# Patient Record
Sex: Male | Born: 1960 | Race: White | Hispanic: No | Smoking: Never smoker
Health system: Southern US, Community
[De-identification: ages and names within clinical notes are randomized; demographics above are authoritative.]

## PROBLEM LIST (undated history)

## (undated) DIAGNOSIS — Z8701 Personal history of pneumonia (recurrent): Secondary | ICD-10-CM

## (undated) HISTORY — PX: COLONOSCOPY: SHX5424

## (undated) HISTORY — PX: TESTICLE SURGERY: SHX794

## (undated) HISTORY — PX: HERNIA REPAIR: SHX51

---

## 1999-10-12 ENCOUNTER — Ambulatory Visit (HOSPITAL_COMMUNITY): Admission: RE | Admit: 1999-10-12 | Discharge: 1999-10-12 | Payer: Self-pay | Admitting: *Deleted

## 1999-10-12 ENCOUNTER — Encounter (INDEPENDENT_AMBULATORY_CARE_PROVIDER_SITE_OTHER): Payer: Self-pay

## 2007-03-23 ENCOUNTER — Ambulatory Visit (HOSPITAL_COMMUNITY): Admission: RE | Admit: 2007-03-23 | Discharge: 2007-03-23 | Payer: Self-pay | Admitting: General Surgery

## 2010-06-30 HISTORY — PX: INGUINAL HERNIA REPAIR: SHX194

## 2010-07-13 NOTE — Op Note (Signed)
NAMEKHIREE, BUKHARI          ACCOUNT NO.:  [id number]   MEDICAL RECORD NO.:  [id number]          PATIENT TYPE:  AMB   LOCATION:  DAY                          FACILITY:  [facility]   PHYSICIAN:  Sharlet Salina T. Hoxworth, M.D.DATE OF BIRTH:  Jul 18, 1960   DATE OF PROCEDURE:  03/23/2007  DATE OF DISCHARGE:                               OPERATIVE REPORT   PREOPERATIVE DIAGNOSES:  Bilateral inguinal hernia.   POSTOPERATIVE DIAGNOSES:  Bilateral inguinal hernia.   SURGICAL PROCEDURES:  Laparoscopic repair of bilateral inguinal hernia.   SURGEON:  Dr. Johna Sheriff.   ANESTHESIA:  General.   BRIEF HISTORY:  Mr. Schreier is a 45-year male who presents with a  gradually increasing somewhat uncomfortable bulge of the left groin.  Examination reveals a good-sized probably direct left inguinal hernia as  well as a definite smaller right inguinal hernia, both reducible.  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  After discussion of options, we have elected to proceed with  laparoscopic bilateral repair of his inguinal hernia.  The nature of the  procedure, risks of anesthesia complications, bleeding, infection,  recurrence, and possible need for open procedure were discussed and  understood.  He is now brought to operating room for this procedure.   DESCRIPTION OF OPERATION:  The patient was brought to the operating room  and placed in the supine position on the operating table and general  endotracheal anesthesia was induced.  A Foley catheter was placed.  The  abdomen was widely sterilely prepped and draped.  Correct patient and  procedure were verified.  He received preoperative IV antibiotics.  A 1  cm incision was made transversely just below the umbilicus and  dissection carried down to the anterior fascia.  This was incised  transversely for 1 cm and the medial edge of the left rectus muscle  identified, retracted laterally and the preperitoneal space  entered  under direct vision.  The balloon dissector was passed with its tip into  this space and then down along the midline to the pubic symphysis.  Then  under laparoscopic vision, the balloon was inflated with apparently good  bilateral deployment.  It was left in place for several minutes for  hemostasis and then removed and the structural balloon trocar placed and  CO2 pressure applied.  Laparoscopy revealed that on the left where he  had his large indirect hernia, there had been somewhat limited  dissection of the peritoneum and the epigastric vessels had been  dissected posteriorly along with the peritoneum.  Initially I tried a  dissection plane between the epigastric vessels and the peritoneum but  they were really quite densely adherent to the peritoneum and I  therefore divided the epigastric vessels between double proximal and  distal clips which then allowed the peritoneum to fall posteriorly and  it was dissected off the anterior abdominal wall well out laterally to  the anterior-superior iliac spine and up as high as the umbilicus.  Going back medially,  the pubic symphysis had been identified previously  and Cooper's ligament was dissected down to the iliac vessels which  were  identified and carefully protected.  There was no direct defect.  There  was a large indirect sac going up to the internal ring with the cord  structures.  This sac was then carefully dissected away from cord  structures.  It was a large sac extending down into the upper scrotum  but that she was able to be completely dissected out of the internal  ring away from cord structures and dissected well posteriorly until the  peritoneal edge was well posterior from the lateral dissection all the  way back medially.  After complete dissection of the left side,  attention was turned to the right side up.  The Cooper's ligament was  again cleared down to the iliac vessels which were identified and   protected.  The peritoneum was then dissected off the anterior abdominal  wall working medial to lateral again dissecting the peritoneum out  laterally to the anterior superior iliac spine and up to the level of  the umbilicus.  The peritoneal edge was then followed back medially and  again there was a indirect hernia present.  There was actually quite a  lot of scarring here of very thin peritoneum to the cord structures from  the patient's previous surgery for undescended testicle. The peritoneum  was able to be dissected down out of the internal ring and off the cord  structures but it was very thin and adherent and peritoneum was opened  for a couple of centimeters.  After complete dissection of the  peritoneum well posteriorly down off the cord structures, it was closed  with clips.  Following this initially on the right side, a large right-  sided Bard 3-D max piece of mesh was introduced in the preperitoneal  space, oriented and then tacked with its medial corner at the pubic  symphysis and then was deployed well out laterally and beginning along  the superior edge where the tack could be felt through the anterior  abdominal wall and was tacked along the superior edge working back  medially avoiding the epigastric vessels, working down the medial edge  to the pubic symphysis and then tacked along Cooper's ligament.  This  provided nice broad coverage of the direct and indirect spaces.  Following this, a left-sided Bard 3-D max piece of mesh was introduced  and was affixed in an identical fashion on the left side.  On the left,  the large indirect sac was then tacked deep to the mesh.  On the right  the peritoneum was definitely seen to be deep to the mesh and the mesh  was held in place while all CO2 was evacuated after hemostasis assured.  Trocars were removed.  The fascial defect was closed with figure-of-  eight suture of #0 Vicryl. The skin was closed with subcuticular 4-0   Monocryl and Dermabond.  Sponge, needle and instrument counts correct.  The patient taken to recovery in good condition.      Lorne Skeens. Hoxworth, M.D.  Electronically Signed     BTH/MEDQ  D:  03/23/2007  T:  03/23/2007  Job:  [id number]

## 2010-07-16 NOTE — Procedures (Signed)
[facility]  Patient:    Aaron Woodward, Aaron Woodward                   MRN: [id number] Proc. Date: 10/12/99 Attending:  Sabino Gasser, M.D.                           Procedure Report  PROCEDURE:  Colonoscopy.  ENDOSCOPIST:  Sabino Gasser, M.D.  INDICATIONS:  Rectal bleeding.  ANESTHESIA:  Demerol 50 mg and Versed 6 mg was given intravenously in divided dose.  DESCRIPTION OF PROCEDURE:  With the patient mildly sedated in the left lateral decubitus position, rectal examination was performed which was unremarkable. Subsequently, the Olympus videoscopic colonoscope was then inserted in the rectum and passed under direct vision to the cecum.  The cecum was identified by the ileocecal valve and appendiceal orifice, both of which were photographed.  We entered into the ileocecal valve to the terminal ileum and some changes in the ileum of polypoid nature were seen, photographed, and biopsied.  From this point, the endoscope was slowly withdrawn, taking circumferential views of the entire colonic mucosa, stopping all the way down in the rectum which appeared normal on direct and retroflexed view, and also, there was a normal exam as we pulled through the anal canal.  The patients vital signs and pulse oximeter remained stable.  The patient tolerated the procedure well without apparent complication.  FINDINGS:  Possible small hemorrhoids of the rectum, otherwise unremarkable examination.  PLAN:  Endoscopy. DD:  10/12/99 TD:  10/12/99 Job: [id number]

## 2010-07-16 NOTE — Procedures (Signed)
[facility]  Patient:    TAL, KEMPKER                   MRN: [id number] Proc. Date: 10/12/99 Adm. Date:  [id number] Disc. Date: [id number] Attending:  Sabino Gasser                           Procedure Report  PROCEDURE:  Upper endoscopy with biopsy.  ENDOSCOPIST:  Sabino Gasser, M.D.  INDICATIONS:  Rectal bleeding.  ANESTHESIA:  Demerol none, Versed 1 mg.  DESCRIPTION OF PROCEDURE:  With the patient mildly sedated in the left lateral decubitus position, the Olympus videoscopic endoscope was inserted into the mouth and passed under direct vision through the esophagus which appeared normal.  The fundus, body and antrum all appeared normal.  We entered into the duodenal bulb and there were mild changes of duodenitis, photographed.  The endoscope was advanced into the second portion of the duodenum which was normal.  From this point, the endoscope was slowly withdrawn, taking circumferential views of the entire duodenal mucosa.  The scope was pulled back into the stomach.  CLO biopsy was taken from the antrum, and the scope was placed in retroflexion to view the stomach from which appeared normal. The endoscope was straightened and withdrawn taking circumferential views of the remaining gastric and esophageal mucosa which appeared normal.  The patients vital signs and pulse oximetry remained stable.  The patient tolerated the procedure well without apparent complications.  FINDINGS:  Mild duodenitis.  PLAN:  Await CLO biopsy report and have patient follow up with me as an outpatient. DD:  10/12/99 TD:  10/12/99 Job: [id number]

## 2010-11-18 LAB — HEMOGLOBIN AND HEMATOCRIT, BLOOD
HCT: 47.5
Hemoglobin: 16.4

## 2013-04-27 ENCOUNTER — Emergency Department (INDEPENDENT_AMBULATORY_CARE_PROVIDER_SITE_OTHER): Payer: BC Managed Care – PPO

## 2013-04-27 ENCOUNTER — Emergency Department
Admission: EM | Admit: 2013-04-27 | Discharge: 2013-04-27 | Disposition: A | Discharge status: Home / Self Care | Payer: BC Managed Care – PPO | Source: Home / Self Care | Attending: Family Medicine | Admitting: Family Medicine

## 2013-04-27 ENCOUNTER — Encounter: Payer: Self-pay | Admitting: Emergency Medicine

## 2013-04-27 DIAGNOSIS — R059 Cough, unspecified: Secondary | ICD-10-CM

## 2013-04-27 DIAGNOSIS — Z8701 Personal history of pneumonia (recurrent): Secondary | ICD-10-CM | POA: Insufficient documentation

## 2013-04-27 DIAGNOSIS — R05 Cough: Secondary | ICD-10-CM

## 2013-04-27 DIAGNOSIS — J209 Acute bronchitis, unspecified: Secondary | ICD-10-CM

## 2013-04-27 HISTORY — DX: Personal history of pneumonia (recurrent): Z87.01

## 2013-04-27 LAB — POCT CBC W AUTO DIFF (K'VILLE URGENT CARE)

## 2013-04-27 MED ORDER — AZITHROMYCIN 250 MG PO TABS: ORAL_TABLET | ORAL | Status: DC

## 2013-04-27 MED ORDER — BENZONATATE 200 MG PO CAPS: 200.0000 mg | ORAL_CAPSULE | Freq: Every day | ORAL | Status: DC

## 2013-04-27 MED ORDER — PREDNISONE 20 MG PO TABS: 20.0000 mg | ORAL_TABLET | Freq: Two times a day (BID) | ORAL | Status: DC

## 2013-04-27 NOTE — ED Provider Notes (Signed)
CSN: [id number]     Arrival date & time 04/27/13  1533 History   First MD Initiated Contact with Patient 04/27/13 1705     Chief Complaint  Patient presents with  . Cough    2 - 3 days      HPI Comments: Patient developed mild sore throat, cough, and nasal congestion that started about 8 days ago.  The non-productive cough has steadily worsened.  The cough awakens him at night.  Two days ago he had chills, and today has had pleuritic pain in his left anterior chest.  He has become more easily winded over the past several days.  The history is provided by the patient.    Past Medical History  Diagnosis Date  . History of pneumonia    Past Surgical History  Procedure Laterality Date  . Testicle surgery    . Hernia repair     Family History  Problem Relation Age of Onset  . COPD Mother    History  Substance Use Topics  . Smoking status: Never Smoker   . Smokeless tobacco: Never Used  . Alcohol Use: No    Review of Systems + sore throat, resolved + cough ? pleuritic pain left lateral chest + wheezing + nasal congestion + post-nasal drainage No sinus pain/pressure No itchy/red eyes No earache No hemoptysis + SOB No fever, + chills No nausea No vomiting No abdominal pain No diarrhea No urinary symptoms No skin rash + fatigue No myalgias + headache Used OTC meds without relief  Allergies  Review of patient's allergies indicates no known allergies.  Home Medications   Current Outpatient Rx  Name  Route  Sig  Dispense  Refill  . Phenylephrine-DM-GG-APAP (DAYTIME SEVERE COLD & FLU PO)   Oral   Take by mouth.         Marland Kitchen azithromycin (ZITHROMAX Z-PAK) 250 MG tablet      Take 2 tabs today; then begin one tab once daily for 4 more days.   6 each   0   . benzonatate (TESSALON) 200 MG capsule   Oral   Take 1 capsule (200 mg total) by mouth at bedtime. Take as needed for cough   12 capsule   0   . predniSONE (DELTASONE) 20 MG tablet   Oral   Take 1  tablet (20 mg total) by mouth 2 (two) times daily. Take with food.   10 tablet   0    BP 107/74  Pulse 93  Temp(Src) 99.6 F (37.6 C) (Oral)  Ht 5\' 8"  (1.727 m)  Wt 160 lb (72.576 kg)  BMI 24.33 kg/m2  SpO2 100% Physical Exam Nursing notes and Vital Signs reviewed. Appearance:  Patient appears healthy, stated age, and in no acute distress Eyes:  Pupils are equal, round, and reactive to light and accomodation.  Extraocular movement is intact.  Conjunctivae are not inflamed  Ears:  Canals normal.  Tympanic membranes normal.  Nose:  Mildly congested turbinates.  No sinus tenderness.  Pharynx:  Normal Neck:  Supple.  No adenopathy Lungs:  Clear to auscultation.  Breath sounds are equal.  Heart:  Regular rate and rhythm without murmurs, rubs, or gallops.  Abdomen:  Nontender without masses or hepatosplenomegaly.  Bowel sounds are present.  No CVA or flank tenderness.  Extremities:  No edema.  No calf tenderness Skin:  No rash present.   ED Course  Procedures  none    Labs Reviewed  POCT CBC W AUTO DIFF ([facility]) WBC:  6.3; LY 16.3; MO 6.0; GR 77.7; Hgb 15.6; Platelets 181    Imaging Review Dg Chest 2 View  04/27/2013   CLINICAL DATA:  Cough  EXAM: CHEST  2 VIEW  COMPARISON:  None.  FINDINGS: Cardiomediastinal silhouette is unremarkable. No acute infiltrate or pleural effusion. No pulmonary edema. Bony thorax is unremarkable.  IMPRESSION: No active cardiopulmonary disease.   Electronically Signed   By: Natasha MeadLiviu  Pop M.D.   On: 04/27/2013 17:53     MDM   1. Acute bronchitis    Begin Z-pack to cover atypicals, and prednisone burst.  Prescription written for Benzonatate (Tessalon) to take at bedtime for night-time cough.  Take plain Mucinex (1200 mg guaifenesin) twice daily for cough and congestion.  May add Sudafed if sinus congestion worsens.   Increase fluid intake, rest. Stop all antihistamines for now, and other non-prescription cough/cold preparations.  Follow-up  with family doctor if not improving 7 to 10 days.     Lattie HawStephen A Beese, MD 04/29/13 [phone]

## 2013-04-27 NOTE — ED Notes (Signed)
Aaron Woodward complains of dry cough for 2 - 3 days. He also reports fevers, body aches, dizziness, headaches, sore throat, runny nose, congestion, wheezing and shortness of breath.

## 2013-04-27 NOTE — Discharge Instructions (Signed)
Take plain Mucinex (1200 mg guaifenesin) twice daily for cough and congestion.  May add Sudafed if sinus congestion worsens.   Increase fluid intake, rest. Stop all antihistamines for now, and other non-prescription cough/cold preparations.  Follow-up with family doctor if not improving 7 to 10 days.

## 2013-05-27 ENCOUNTER — Encounter: Payer: Self-pay | Admitting: Emergency Medicine

## 2013-05-27 ENCOUNTER — Emergency Department
Admission: EM | Admit: 2013-05-27 | Discharge: 2013-05-27 | Disposition: A | Discharge status: Home / Self Care | Payer: BC Managed Care – PPO | Source: Home / Self Care | Attending: Family Medicine | Admitting: Family Medicine

## 2013-05-27 DIAGNOSIS — R053 Chronic cough: Secondary | ICD-10-CM

## 2013-05-27 DIAGNOSIS — R05 Cough: Secondary | ICD-10-CM

## 2013-05-27 DIAGNOSIS — R059 Cough, unspecified: Secondary | ICD-10-CM

## 2013-05-27 LAB — POCT CBC W AUTO DIFF (K'VILLE URGENT CARE)

## 2013-05-27 MED ORDER — TUBERCULIN PPD 5 UNIT/0.1ML ID SOLN
5.0000 [IU] | Freq: Once | INTRADERMAL | Status: DC
  Administered 2013-05-27: 5 [IU] via INTRADERMAL

## 2013-05-27 NOTE — ED Notes (Signed)
Pt c/o lingering non productive cough with LT rib area pain since his visit on 04/27/13. Denies fever, however he does report having night sweats last night.

## 2013-05-27 NOTE — Discharge Instructions (Signed)
May try Tessalon (benzonatate) daytime for cough   Cough, Adult  A cough is a reflex that helps clear your throat and airways. It can help heal the body or may be a reaction to an irritated airway. A cough may only last 2 or 3 weeks (acute) or may last more than 8 weeks (chronic).  CAUSES Acute cough:  Viral or bacterial infections. Chronic cough:  Infections.  Allergies.  Asthma.  Post-nasal drip.  Smoking.  Heartburn or acid reflux.  Some medicines.  Chronic lung problems (COPD).  Cancer. SYMPTOMS   Cough.  Fever.  Chest pain.  Increased breathing rate.  High-pitched whistling sound when breathing (wheezing).  Colored mucus that you cough up (sputum). TREATMENT   A bacterial cough may be treated with antibiotic medicine.  A viral cough must run its course and will not respond to antibiotics.  Your caregiver may recommend other treatments if you have a chronic cough. HOME CARE INSTRUCTIONS   Only take over-the-counter or prescription medicines for pain, discomfort, or fever as directed by your caregiver. Use cough suppressants only as directed by your caregiver.  Use a cold steam vaporizer or humidifier in your bedroom or home to help loosen secretions.  Sleep in a semi-upright position if your cough is worse at night.  Rest as needed.  Stop smoking if you smoke. SEEK IMMEDIATE MEDICAL CARE IF:   You have pus in your sputum.  Your cough starts to worsen.  You cannot control your cough with suppressants and are losing sleep.  You begin coughing up blood.  You have difficulty breathing.  You develop pain which is getting worse or is uncontrolled with medicine.  You have a fever. MAKE SURE YOU:   Understand these instructions.  Will watch your condition.  Will get help right away if you are not doing well or get worse. Document Released: 08/13/2010 Document Revised: 05/09/2011 Document Reviewed: 08/13/2010 [facility] Patient Information  2014 [employer] [place].

## 2013-05-27 NOTE — ED Provider Notes (Signed)
CSN: [id number]     Arrival date & time 05/27/13  1600 History   First MD Initiated Contact with Patient 05/27/13 1614     Chief Complaint  Patient presents with  . Cough      HPI Comments: Patient was treated for a bronchitis approximately one month ago with azithromycin and prednisone burst.  Chest x-ray at that time was negative, and white blood count was not elevated.  He improved initially, and his cough then became persistent.  The cough is worse in the morning and non-productive.  He denies fever, but last night had sweats.  He has developed a lingering pleuritic pain in his left anterior ribs.  He denies history of reflux symptoms.  The history is provided by the patient.    Past Medical History  Diagnosis Date  . History of pneumonia    Past Surgical History  Procedure Laterality Date  . Testicle surgery    . Hernia repair     Family History  Problem Relation Age of Onset  . COPD Mother    History  Substance Use Topics  . Smoking status: Never Smoker   . Smokeless tobacco: Never Used  . Alcohol Use: No    Review of Systems No sore throat + cough + left pleuritic pain No wheezing No nasal congestion No post-nasal drainage No sinus pain/pressure No itchy/red eyes No earache No hemoptysis No SOB No fever/chills, + night sweats No nausea No vomiting No abdominal pain No diarrhea No urinary symptoms No skin rash No fatigue No myalgias No headache    Allergies  Review of patient's allergies indicates no known allergies.  Home Medications   Current Outpatient Rx  Name  Route  Sig  Dispense  Refill  . azithromycin (ZITHROMAX Z-PAK) 250 MG tablet      Take 2 tabs today; then begin one tab once daily for 4 more days.   6 each   0   . benzonatate (TESSALON) 200 MG capsule   Oral   Take 1 capsule (200 mg total) by mouth at bedtime. Take as needed for cough   12 capsule   0   . Phenylephrine-DM-GG-APAP (DAYTIME SEVERE COLD & FLU PO)   Oral  Take by mouth.         . predniSONE (DELTASONE) 20 MG tablet   Oral   Take 1 tablet (20 mg total) by mouth 2 (two) times daily. Take with food.   10 tablet   0    BP 107/74  Pulse 78  Temp(Src) 98.9 F (37.2 C) (Oral)  Resp 16  Ht 5\' 8"  (1.727 m)  Wt 154 lb (69.854 kg)  BMI 23.42 kg/m2  SpO2 97% Physical Exam  Nursing note and vitals reviewed. Constitutional: He is oriented to person, place, and time. He appears well-developed and well-nourished. No distress.  HENT:  Head: Normocephalic and atraumatic.  Nose: Nose normal.  Mouth/Throat: Oropharynx is clear and moist.  tympanic membranes normal  Eyes: Conjunctivae are normal. Pupils are equal, round, and reactive to light.  Neck: Neck supple.  Cardiovascular: Normal heart sounds.   Pulmonary/Chest: Breath sounds normal. No respiratory distress. He has no wheezes. He has no rales. He exhibits tenderness.    There is tenderness over the left anterior/inferior ribs as noted on diagram.  Abdominal: He exhibits no mass. There is no tenderness.  Musculoskeletal: He exhibits no edema.  Lymphadenopathy:    He has no cervical adenopathy.  Neurological: He is alert and oriented to  person, place, and time.  Skin: Skin is warm and dry. No rash noted.    ED Course  Procedures  none    Labs Reviewed  MISCELLANEOUS TEST  POCT CBC W AUTO DIFF ([facility]):  WBC 9.1; LY 35.7; MO 4.4; GR 59.9; Hgb 16.2; Platelets 231         MDM   1. Persistent cough    Check pertussis antibodies and PPD May try Tessalon (benzonatate) daytime for cough If not improved two weeks consider pulmonology evaluation.    Lattie Haw, MD 06/01/13 [phone]

## 2013-05-29 ENCOUNTER — Emergency Department
Admission: EM | Admit: 2013-05-29 | Discharge: 2013-05-29 | Disposition: A | Discharge status: Home / Self Care | Payer: Self-pay | Source: Home / Self Care

## 2013-05-29 LAB — READ PPD: TB Skin Test: NEGATIVE

## 2013-05-29 LAB — BORDETELLA PERTUSSIS PCR
B parapertussis, DNA: NOT DETECTED
B pertussis, DNA: NOT DETECTED

## 2013-05-29 NOTE — ED Notes (Signed)
The pt is here today for a PPD reading. Results negative, 00mm. Pt also request ABT for his persistent cough. Per dr Cathren Harshbeese called in Biaxin 250mg  i po bid # 20/0 to [facility] [place].

## 2013-06-10 LAB — CULTURE, BORDETELLA PERTUSSIS

## 2013-06-11 ENCOUNTER — Telehealth: Payer: Self-pay | Admitting: *Deleted

## 2014-06-09 ENCOUNTER — Emergency Department (INDEPENDENT_AMBULATORY_CARE_PROVIDER_SITE_OTHER): Payer: BLUE CROSS/BLUE SHIELD

## 2014-06-09 ENCOUNTER — Emergency Department
Admission: EM | Admit: 2014-06-09 | Discharge: 2014-06-09 | Disposition: A | Discharge status: Home / Self Care | Payer: BLUE CROSS/BLUE SHIELD | Source: Home / Self Care | Attending: Family Medicine | Admitting: Family Medicine

## 2014-06-09 ENCOUNTER — Encounter: Payer: Self-pay | Admitting: Emergency Medicine

## 2014-06-09 DIAGNOSIS — R053 Chronic cough: Secondary | ICD-10-CM

## 2014-06-09 DIAGNOSIS — R05 Cough: Secondary | ICD-10-CM | POA: Diagnosis not present

## 2014-06-09 DIAGNOSIS — R0989 Other specified symptoms and signs involving the circulatory and respiratory systems: Secondary | ICD-10-CM

## 2014-06-09 DIAGNOSIS — Z8701 Personal history of pneumonia (recurrent): Secondary | ICD-10-CM | POA: Diagnosis not present

## 2014-06-09 MED ORDER — MONTELUKAST SODIUM 10 MG PO TABS: 10.0000 mg | ORAL_TABLET | Freq: Every day | ORAL | Status: DC

## 2014-06-09 MED ORDER — PREDNISONE 20 MG PO TABS: 20.0000 mg | ORAL_TABLET | Freq: Two times a day (BID) | ORAL | Status: DC

## 2014-06-09 MED ORDER — AZITHROMYCIN 250 MG PO TABS: ORAL_TABLET | ORAL | Status: DC

## 2014-06-09 NOTE — ED Notes (Signed)
Dry cough x 5 weeks

## 2014-06-09 NOTE — Discharge Instructions (Signed)
Stop all antihistamines for now. May take Delsym Cough Suppressant at bedtime for nighttime cough.   Begin Azithromycin if not improving about one week or if persistent fever develops  Follow-up with family doctor if not improving about10 days.

## 2014-06-09 NOTE — ED Provider Notes (Signed)
CSN: [id number]     Arrival date & time 06/09/14  [id number] History   First MD Initiated Contact with Patient 06/09/14 1135     Chief Complaint  Patient presents with  . Cough     HPI Comments: Patient complains of onset of a non-productive cough and mildly irritated throat about 5 weeks ago.  The cough has persisted but he does not feel ill.  He had a similar prolonged cough one year ago.  No fevers, chills, and sweats.  No pleuritic pain.  No shortness of breath but he wheezes at times.  No past history of asthma.  The history is provided by the patient.    Past Medical History  Diagnosis Date  . History of pneumonia    Past Surgical History  Procedure Laterality Date  . Testicle surgery    . Hernia repair     Family History  Problem Relation Age of Onset  . COPD Mother   . Diabetes Mother    History  Substance Use Topics  . Smoking status: Never Smoker   . Smokeless tobacco: Never Used  . Alcohol Use: No    Review of Systems + minimal sore throat + cough + sneezing No pleuritic pain + wheezing + nasal congestion ? post-nasal drainage No sinus pain/pressure No itchy/red eyes No earache No hemoptysis No SOB No fever/chills No nausea No vomiting No abdominal pain No diarrhea No urinary symptoms No skin rash No fatigue No myalgias No headache Used OTC meds without relief  Allergies  Review of patient's allergies indicates not on file.  Home Medications   Prior to Admission medications   Medication Sig Start Date End Date Taking? Authorizing Provider  pneumococcal 13-valent conjugate vaccine (PREVNAR 13) SUSP injection Inject 0.5 mLs into the muscle tomorrow at 10 am.   Yes Historical Provider, MD  azithromycin (ZITHROMAX Z-PAK) 250 MG tablet Take 2 tabs today; then begin one tab once daily for 4 more days. (Rx void after 06/17/14) 06/09/14   Lattie HawStephen A , MD  montelukast (SINGULAIR) 10 MG tablet Take 1 tablet (10 mg total) by mouth at bedtime. 06/09/14    Lattie HawStephen A , MD  Phenylephrine-DM-GG-APAP (DAYTIME SEVERE COLD & FLU PO) Take by mouth.    Historical Provider, MD  predniSONE (DELTASONE) 20 MG tablet Take 1 tablet (20 mg total) by mouth 2 (two) times daily. Take with food. 06/09/14   Lattie HawStephen A , MD   BP 114/74 mmHg  Pulse 66  Temp(Src) 97.8 F (36.6 C) (Oral)  Ht 5\' 8"  (1.727 m)  Wt 163 lb (73.936 kg)  BMI 24.79 kg/m2  SpO2 99% Physical Exam Nursing notes and Vital Signs reviewed. Appearance:  Patient appears stated age, and in no acute distress Eyes:  Pupils are equal, round, and reactive to light and accomodation.  Extraocular movement is intact.  Conjunctivae are not inflamed  Ears:  Canals normal.  Tympanic membranes normal.  Nose:  Mildly congested turbinates.  No sinus tenderness.  Pharynx:  Normal Neck:  Supple.  No adenopathy Lungs:  Clear to auscultation.  Breath sounds are equal.  Heart:  Regular rate and rhythm without murmurs, rubs, or gallops.  Abdomen:  Nontender without masses or hepatosplenomegaly.  Bowel sounds are present.  No CVA or flank tenderness.  Extremities:  No edema.  No calf tenderness Skin:  No rash present.   ED Course  Procedures  none  Imaging Review Dg Chest 2 View  06/09/2014   CLINICAL DATA:  Five week  history of cough and congestion  EXAM: CHEST  2 VIEW  COMPARISON:  April 27, 2013  FINDINGS: There is no edema or consolidation. The heart size and pulmonary vascularity are normal. No adenopathy. No pneumothorax. No bone lesions.  IMPRESSION: No edema or consolidation.   Electronically Signed   By: Bretta Bang III M.D.   On: 06/09/2014 11:46     MDM   1. Persistent cough for 3 weeks or longer; suspect allergy mediated  2. History of pneumonia    Begin prednisone burst.  Begin trial of Singulair Stop all antihistamines for now. May take Delsym Cough Suppressant at bedtime for nighttime cough.   Begin Azithromycin if not improving about one week or if persistent fever  develops (Given a prescription to hold, with an expiration date)  Follow-up with family doctor if not improving about10 days.     Lattie Haw, MD 06/11/14 [phone]

## 2015-01-13 ENCOUNTER — Encounter: Payer: Self-pay | Admitting: *Deleted

## 2015-01-13 ENCOUNTER — Emergency Department
Admission: EM | Admit: 2015-01-13 | Discharge: 2015-01-13 | Disposition: A | Discharge status: Home / Self Care | Payer: BLUE CROSS/BLUE SHIELD | Source: Home / Self Care | Attending: Family Medicine | Admitting: Family Medicine

## 2015-01-13 DIAGNOSIS — J069 Acute upper respiratory infection, unspecified: Secondary | ICD-10-CM | POA: Diagnosis not present

## 2015-01-13 MED ORDER — DM-GUAIFENESIN ER 30-600 MG PO TB12: 1.0000 | ORAL_TABLET | Freq: Two times a day (BID) | ORAL | Status: DC

## 2015-01-13 MED ORDER — AZITHROMYCIN 250 MG PO TABS: 250.0000 mg | ORAL_TABLET | Freq: Every day | ORAL | Status: DC

## 2015-01-13 MED ORDER — BENZONATATE 100 MG PO CAPS: 100.0000 mg | ORAL_CAPSULE | Freq: Three times a day (TID) | ORAL | Status: DC

## 2015-01-13 NOTE — ED Notes (Signed)
Pt c/o non productive cough x 3-4 days. Denies fever. Reports a hx of pneumonia.

## 2015-01-13 NOTE — Discharge Instructions (Signed)
You may take 400-600mg  Ibuprofen (Motrin) every 6-8 hours for fever and pain  Alternate with Tylenol  You may take  Tylenol every 4-6 hours as needed for fever and pain  Follow-up with your primary care provider next week for recheck of symptoms if not improving.  Be sure to drink plenty of fluids and rest, at least 8hrs of sleep a night, preferably more while you are sick. Return urgent care or go to closest ER if you cannot keep down fluids/signs of dehydration, fever not reducing with Tylenol, difficulty breathing/wheezing, stiff neck, worsening condition, or other concerns (see below)   [employer] may help relieve the symptoms of a cough and cold. They add moisture to the air, which helps mucus to become thinner and less sticky. This makes it easier to breathe and cough up secretions. Cool mist vaporizers do not cause serious burns like hot mist vaporizers, which may also be called steamers or humidifiers. Vaporizers have not been proven to help with colds. You should not use a vaporizer if you are allergic to mold. HOME CARE INSTRUCTIONS  Follow the package instructions for the vaporizer.  Do not use anything other than distilled water in the vaporizer.  Do not run the vaporizer all of the time. This can cause mold or bacteria to grow in the vaporizer.  Clean the vaporizer after each time it is used.  Clean and dry the vaporizer well before storing it.  Stop using the vaporizer if worsening respiratory symptoms develop.   This information is not intended to replace advice given to you by your health care provider. Make sure you discuss any questions you have with your health care provider.   Document Released: 11/12/2003 Document Revised: 02/19/2013 Document Reviewed: 07/04/2012 Elsevier Interactive Patient Education 2016 Elsevier Inc.  Upper Respiratory Infection, Adult Most upper respiratory infections (URIs) are a viral infection of the air passages  leading to the lungs. A URI affects the nose, throat, and upper air passages. The most common type of URI is nasopharyngitis and is typically referred to as "the common cold." URIs run their course and usually go away on their own. Most of the time, a URI does not require medical attention, but sometimes a bacterial infection in the upper airways can follow a viral infection. This is called a secondary infection. Sinus and middle ear infections are common types of secondary upper respiratory infections. Bacterial pneumonia can also complicate a URI. A URI can worsen asthma and chronic obstructive pulmonary disease (COPD). Sometimes, these complications can require emergency medical care and may be life threatening.  CAUSES Almost all URIs are caused by viruses. A virus is a type of germ and can spread from one person to another.  RISKS FACTORS You may be at risk for a URI if:   You smoke.   You have chronic heart or lung disease.  You have a weakened defense (immune) system.   You are very young or very old.   You have nasal allergies or asthma.  You work in crowded or poorly ventilated areas.  You work in health care facilities or schools. SIGNS AND SYMPTOMS  Symptoms typically develop 2-3 days after you come in contact with a cold virus. Most viral URIs last 7-10 days. However, viral URIs from the influenza virus (flu virus) can last 14-18 days and are typically more severe. Symptoms may include:   Runny or stuffy (congested) nose.   Sneezing.   Cough.   Sore throat.  Headache.   Fatigue.   Fever.   Loss of appetite.   Pain in your forehead, behind your eyes, and over your cheekbones (sinus pain).  Muscle aches.  DIAGNOSIS  Your health care provider may diagnose a URI by:  Physical exam.  Tests to check that your symptoms are not due to another condition such as:  Strep throat.  Sinusitis.  Pneumonia.  Asthma. TREATMENT  A URI goes away on its  own with time. It cannot be cured with medicines, but medicines may be prescribed or recommended to relieve symptoms. Medicines may help:  Reduce your fever.  Reduce your cough.  Relieve nasal congestion. HOME CARE INSTRUCTIONS   Take medicines only as directed by your health care provider.   Gargle warm saltwater or take cough drops to comfort your throat as directed by your health care provider.  Use a warm mist humidifier or inhale steam from a shower to increase air moisture. This may make it easier to breathe.  Drink enough fluid to keep your urine clear or pale yellow.   Eat soups and other clear broths and maintain good nutrition.   Rest as needed.   Return to work when your temperature has returned to normal or as your health care provider advises. You may need to stay home longer to avoid infecting others. You can also use a face mask and careful hand washing to prevent spread of the virus.  Increase the usage of your inhaler if you have asthma.   Do not use any tobacco products, including cigarettes, chewing tobacco, or electronic cigarettes. If you need help quitting, ask your health care provider. PREVENTION  The best way to protect yourself from getting a cold is to practice good hygiene.   Avoid oral or hand contact with people with cold symptoms.   Wash your hands often if contact occurs.  There is no clear evidence that vitamin C, vitamin E, echinacea, or exercise reduces the chance of developing a cold. However, it is always recommended to get plenty of rest, exercise, and practice good nutrition.  SEEK MEDICAL CARE IF:   You are getting worse rather than better.   Your symptoms are not controlled by medicine.   You have chills.  You have worsening shortness of breath.  You have brown or red mucus.  You have yellow or brown nasal discharge.  You have pain in your face, especially when you bend forward.  You have a fever.  You have swollen  neck glands.  You have pain while swallowing.  You have white areas in the back of your throat. SEEK IMMEDIATE MEDICAL CARE IF:   You have severe or persistent:  Headache.  Ear pain.  Sinus pain.  Chest pain.  You have chronic lung disease and any of the following:  Wheezing.  Prolonged cough.  Coughing up blood.  A change in your usual mucus.  You have a stiff neck.  You have changes in your:  Vision.  Hearing.  Thinking.  Mood. MAKE SURE YOU:   Understand these instructions.  Will watch your condition.  Will get help right away if you are not doing well or get worse.   This information is not intended to replace advice given to you by your health care provider. Make sure you discuss any questions you have with your health care provider.   Document Released: 08/10/2000 Document Revised: 07/01/2014 Document Reviewed: 05/22/2013 Elsevier Interactive Patient Education [employer]2016 Elsevier Inc.

## 2015-01-13 NOTE — ED Provider Notes (Signed)
CSN: [id number]     Arrival date & time 01/13/15  [id number] History   First MD Initiated Contact with Patient 01/13/15 [phone]     Chief Complaint  Patient presents with  . Cough   (Consider location/radiation/quality/duration/timing/severity/associated sxs/prior Treatment) HPI Pt is a 54yo [facility]/o gradually worsening moderately intermittent non-productive cough for 3-4 days.  Pt reports subjective fever with hot and cold chills as well as body aches and sore throat. He has tried Nyquil twice with minimal relief.  Pt states he has a hx of pneumonia around this time every year but he wanted to catch it early this year in hopes it would not develop into pneumonia. Denies chest pain or SOB. Denies hx of asthma or COPD. No recent travel or sick contacts. He did not receive the flu vaccine this year as pt states he never does.   Past Medical History  Diagnosis Date  . History of pneumonia    Past Surgical History  Procedure Laterality Date  . Testicle surgery    . Hernia repair     Family History  Problem Relation Age of Onset  . COPD Mother   . Diabetes Mother    Social History  Substance Use Topics  . Smoking status: Never Smoker   . Smokeless tobacco: Never Used  . Alcohol Use: No    Review of Systems  Constitutional: Positive for fever ( subjective) and chills.  HENT: Positive for congestion, sneezing and sore throat. Negative for ear pain, trouble swallowing and voice change.   Respiratory: Positive for cough and wheezing. Negative for shortness of breath and stridor.   Cardiovascular: Negative for chest pain and palpitations.  Gastrointestinal: Negative for nausea, vomiting, abdominal pain and diarrhea.  Musculoskeletal: Positive for myalgias and arthralgias. Negative for back pain.  Skin: Negative for rash.    Allergies  Review of patient's allergies indicates no known allergies.  Home Medications   Prior to Admission medications   Medication Sig  Start Date End Date Taking? Authorizing Provider  azithromycin (ZITHROMAX) 250 MG tablet Take 1 tablet (250 mg total) by mouth daily. Take first 2 tablets together, then 1 every day until finished. 01/13/15   Junius FinnerErin O'Malley, PA-C  benzonatate (TESSALON) 100 MG capsule Take 1 capsule (100 mg total) by mouth every 8 (eight) hours. 01/13/15   Junius FinnerErin O'Malley, PA-C  dextromethorphan-guaiFENesin (MUCINEX DM) 30-600 MG 12hr tablet Take 1 tablet by mouth 2 (two) times daily. Take with a large glass of water 01/13/15   Junius FinnerErin O'Malley, PA-C   Meds Ordered and Administered this Visit  Medications - No data to display  BP 119/80 mmHg  Pulse 57  Temp(Src) 97.9 F (36.6 C) (Oral)  Resp 16  Ht 5\' 8"  (1.727 m)  Wt 164 lb (74.39 kg)  BMI 24.94 kg/m2  SpO2 100% No data found.   Physical Exam  Constitutional: He appears well-developed and well-nourished.  HENT:  Head: Normocephalic and atraumatic.  Right Ear: Hearing, tympanic membrane, external ear and ear canal normal.  Left Ear: Hearing, tympanic membrane, external ear and ear canal normal.  Nose: Mucosal edema present.  Mouth/Throat: Uvula is midline and mucous membranes are normal. Posterior oropharyngeal erythema present. No oropharyngeal exudate, posterior oropharyngeal edema or tonsillar abscesses.  Eyes: Conjunctivae are normal. No scleral icterus.  Neck: Normal range of motion. Neck supple.  Cardiovascular: Normal rate, regular rhythm and normal heart sounds.   Pulmonary/Chest: Effort normal and breath sounds normal. No respiratory distress. He has  no wheezes. He has no rales. He exhibits no tenderness.  Intermittent dry cough during exam. No respiratory distress Lungs: CTAB  Abdominal: Soft. He exhibits no distension and no mass. There is no tenderness. There is no rebound and no guarding.  Musculoskeletal: Normal range of motion.  Neurological: He is alert.  Skin: Skin is warm and dry.  Nursing note and vitals reviewed.   ED Course   Procedures (including critical care time)  Labs Review Labs Reviewed - No data to display  Imaging Review No results found.    MDM   1. Acute upper respiratory infection    Pt c/o 3-4 days of URI symptoms including a non-productive cough. Hx of pneumonia. Pt appears well, non-toxic. No respiratory distress. Vitals: unremarkable.   Advised pt he may use symptomatic treatment for 2-3 more days, then start antibiotic if not improving, sooner if worsening. Rx: Azithromycin, tessalon, and mucinex DM Advised pt to use acetaminophen and ibuprofen as needed for fever and pain. Encouraged rest and fluids. F/u with PCP in 10 days if not improving.  Pt verbalized understanding and agreement with tx plan.     Junius Finner, PA-C 01/13/15 [phone]

## 2015-02-13 ENCOUNTER — Encounter: Payer: Self-pay | Admitting: Emergency Medicine

## 2015-02-13 ENCOUNTER — Emergency Department
Admission: EM | Admit: 2015-02-13 | Discharge: 2015-02-13 | Disposition: A | Discharge status: Home / Self Care | Payer: BLUE CROSS/BLUE SHIELD | Source: Home / Self Care | Attending: Family Medicine | Admitting: Family Medicine

## 2015-02-13 ENCOUNTER — Emergency Department (INDEPENDENT_AMBULATORY_CARE_PROVIDER_SITE_OTHER): Payer: BLUE CROSS/BLUE SHIELD

## 2015-02-13 DIAGNOSIS — R053 Chronic cough: Secondary | ICD-10-CM

## 2015-02-13 DIAGNOSIS — R05 Cough: Secondary | ICD-10-CM

## 2015-02-13 MED ORDER — FLUTICASONE PROPIONATE 50 MCG/ACT NA SUSP
2.0000 | Freq: Every day | NASAL | Status: DC
Start: 2015-02-13 — End: 2020-01-12

## 2015-02-13 MED ORDER — ALBUTEROL SULFATE HFA 108 (90 BASE) MCG/ACT IN AERS: 1.0000 | INHALATION_SPRAY | Freq: Four times a day (QID) | RESPIRATORY_TRACT | Status: DC | PRN

## 2015-02-13 MED ORDER — PREDNISONE 20 MG PO TABS: ORAL_TABLET | ORAL | Status: DC

## 2015-02-13 MED ORDER — MONTELUKAST SODIUM 10 MG PO TABS: 10.0000 mg | ORAL_TABLET | Freq: Every day | ORAL | Status: DC

## 2015-02-13 NOTE — ED Notes (Signed)
Pt c/o dry, non productive cough x1 month. He took zpak last month and noticed improvement for about 1 week and then sxs returned. C/o intermittent HA and fatigue.

## 2015-02-13 NOTE — Discharge Instructions (Signed)
Cool Mist Vaporizers °Vaporizers may help relieve the symptoms of a cough and cold. They add moisture to the air, which helps mucus to become thinner and less sticky. This makes it easier to breathe and cough up secretions. Cool mist vaporizers do not cause serious burns like hot mist vaporizers, which may also be called steamers or humidifiers. Vaporizers have not been proven to help with colds. You should not use a vaporizer if you are allergic to mold. °HOME CARE INSTRUCTIONS °· Follow the package instructions for the vaporizer. °· Do not use anything other than distilled water in the vaporizer. °· Do not run the vaporizer all of the time. This can cause mold or bacteria to grow in the vaporizer. °· Clean the vaporizer after each time it is used. °· Clean and dry the vaporizer well before storing it. °· Stop using the vaporizer if worsening respiratory symptoms develop. °  °This information is not intended to replace advice given to you by your health care provider. Make sure you discuss any questions you have with your health care provider. °  °Document Released: 11/12/2003 Document Revised: 02/19/2013 Document Reviewed: 07/04/2012 °Elsevier Interactive Patient Education ©2016 Elsevier Inc. ° °Cough, Adult °A cough helps to clear your throat and lungs. A cough may last only 2-3 weeks (acute), or it may last longer than 8 weeks (chronic). Many different things can cause a cough. A cough may be a sign of an illness or another medical condition. °HOME CARE °· Pay attention to any changes in your cough. °· Take medicines only as told by your doctor. °¨ If you were prescribed an antibiotic medicine, take it as told by your doctor. Do not stop taking it even if you start to feel better. °¨ Talk with your doctor before you try using a cough medicine. °· Drink enough fluid to keep your pee (urine) clear or pale yellow. °· If the air is dry, use a cold steam vaporizer or humidifier in your home. °· Stay away from things  that make you cough at work or at home. °· If your cough is worse at night, try using extra pillows to raise your head up higher while you sleep. °· Do not smoke, and try not to be around smoke. If you need help quitting, ask your doctor. °· Do not have caffeine. °· Do not drink alcohol. °· Rest as needed. °GET HELP IF: °· You have new problems (symptoms). °· You cough up yellow fluid (pus). °· Your cough does not get better after 2-3 weeks, or your cough gets worse. °· Medicine does not help your cough and you are not sleeping well. °· You have pain that gets worse or pain that is not helped with medicine. °· You have a fever. °· You are losing weight and you do not know why. °· You have night sweats. °GET HELP RIGHT AWAY IF: °· You cough up blood. °· You have trouble breathing. °· Your heartbeat is very fast. °  °This information is not intended to replace advice given to you by your health care provider. Make sure you discuss any questions you have with your health care provider. °  °Document Released: 10/28/2010 Document Revised: 11/05/2014 Document Reviewed: 04/23/2014 °Elsevier Interactive Patient Education ©2016 Elsevier Inc. ° °

## 2015-02-13 NOTE — ED Provider Notes (Signed)
CSN: [id number]     Arrival date & time 02/13/15  [id number] History   First MD Initiated Contact with Patient 02/13/15 0820     Chief Complaint  Patient presents with  . Cough   (Consider location/radiation/quality/duration/timing/severity/associated sxs/prior Treatment) HPI Pt is a 54yo male [facility] with c/o persistent cough for 1 month.  Pt was seen on 01/13/15 for a cough he had for 3 days at that time.  He did take a full course of Azithromycin and felt moderately improved for about 1 week but cough is now as bad as it initially was.  Pt c/o moderately intermittent dry cough with associated generalized headache, mild fatigue, nasal congestion and sneezing.  Denies known hx of asthma but does not he had a persistent cough last year and during the summer, he develops a cough when he runs.  Denies fever, chills, n/v/d. No sick contacts or recent travel.   Past Medical History  Diagnosis Date  . History of pneumonia    Past Surgical History  Procedure Laterality Date  . Testicle surgery    . Hernia repair     Family History  Problem Relation Age of Onset  . COPD Mother   . Diabetes Mother    Social History  Substance Use Topics  . Smoking status: Never Smoker   . Smokeless tobacco: Never Used  . Alcohol Use: No    Review of Systems  Constitutional: Negative for fever and chills.  HENT: Positive for congestion, rhinorrhea and sneezing. Negative for ear pain, sore throat, trouble swallowing and voice change.   Respiratory: Positive for cough. Negative for shortness of breath.   Cardiovascular: Negative for chest pain and palpitations.  Gastrointestinal: Negative for nausea, vomiting, abdominal pain and diarrhea.  Musculoskeletal: Negative for myalgias, back pain and arthralgias.  Skin: Negative for rash.  Neurological: Positive for headaches. Negative for dizziness and light-headedness.    Allergies  Review of patient's allergies indicates no known allergies.  Home  Medications   Prior to Admission medications   Medication Sig Start Date End Date Taking? Authorizing Provider  albuterol (PROVENTIL HFA;VENTOLIN HFA) 108 (90 BASE) MCG/ACT inhaler Inhale 1-2 puffs into the lungs every 6 (six) hours as needed for wheezing or shortness of breath. 02/13/15   Junius FinnerErin O'Malley, PA-C  azithromycin (ZITHROMAX) 250 MG tablet Take 1 tablet (250 mg total) by mouth daily. Take first 2 tablets together, then 1 every day until finished. 01/13/15   Junius FinnerErin O'Malley, PA-C  benzonatate (TESSALON) 100 MG capsule Take 1 capsule (100 mg total) by mouth every 8 (eight) hours. 01/13/15   Junius FinnerErin O'Malley, PA-C  dextromethorphan-guaiFENesin (MUCINEX DM) 30-600 MG 12hr tablet Take 1 tablet by mouth 2 (two) times daily. Take with a large glass of water 01/13/15   Junius FinnerErin O'Malley, PA-C  fluticasone [facility](FLONASE) 50 MCG/ACT nasal spray Place 2 sprays into both nostrils daily. 02/13/15   Junius FinnerErin O'Malley, PA-C  montelukast (SINGULAIR) 10 MG tablet Take 1 tablet (10 mg total) by mouth at bedtime. 02/13/15   Junius FinnerErin O'Malley, PA-C  predniSONE (DELTASONE) 20 MG tablet 3 tabs po day one, then 2 po daily x 4 days 02/13/15   Junius FinnerErin O'Malley, PA-C   Meds Ordered and Administered this Visit  Medications - No data to display  BP 116/80 mmHg  Pulse 66  Temp(Src) 97.6 F (36.4 C) (Oral)  Wt 163 lb (73.936 kg)  SpO2 99% No data found.   Physical Exam  Constitutional: He appears well-developed and well-nourished.  HENT:  Head: Normocephalic and atraumatic.  Right Ear: Hearing, tympanic membrane, external ear and ear canal normal.  Left Ear: Hearing, tympanic membrane, external ear and ear canal normal.  Nose: Mucosal edema present.  Mouth/Throat: Uvula is midline, oropharynx is clear and moist and mucous membranes are normal.  Eyes: Conjunctivae are normal. No scleral icterus.  Neck: Normal range of motion. Neck supple.  Cardiovascular: Normal rate, regular rhythm and normal heart sounds.   Pulmonary/Chest:  Effort normal and breath sounds normal. No respiratory distress. He has no wheezes. He has no rales. He exhibits no tenderness.  Intermittent dry cough during exam  Abdominal: Soft. He exhibits no distension. There is no tenderness.  Musculoskeletal: Normal range of motion.  Neurological: He is alert.  Skin: Skin is warm and dry.  Nursing note and vitals reviewed.   ED Course  Procedures (including critical care time)  Labs Review Labs Reviewed - No data to display  Imaging Review Dg Chest 2 View  02/13/2015  CLINICAL DATA:  Cough for 5 weeks EXAM: CHEST  2 VIEW COMPARISON:  June 09, 2014 FINDINGS: There is no edema or consolidation. The heart size and pulmonary vascularity are normal. No adenopathy. No bone lesions. IMPRESSION: No edema or consolidation. Electronically Signed   By: Bretta Bang III M.D.   On: 02/13/2015 08:54      MDM   1. Persistent cough for 3 weeks or longer     Pt c/o persistent dry cough. Hx of similar cough last year. He did complete a course of Azithromycin last month.  O2 Sat 99% on RA, no signs of respiratory distress. Doubt PE or ACS.  CXR ordered to r/o infection and other underlying cardiopulmonary disease that could be exacerbation cough.  CXR: no edema or consolidation  Will tx symptomatically, cough possible due to reactive airway disease Rx: prednisone, singulair, albuterol inhaler, Flonase.  Encouraged to establish care with a primary care provider for ongoing healthcare needs and recheck of symptoms if not improving in 7-10 days.  Patient verbalized understanding and agreement with treatment plan.   Junius Finner, PA-C 02/13/15 [phone]

## 2015-09-07 ENCOUNTER — Other Ambulatory Visit: Payer: Self-pay | Admitting: Urology

## 2015-09-07 DIAGNOSIS — R972 Elevated prostate specific antigen [PSA]: Secondary | ICD-10-CM

## 2015-09-15 ENCOUNTER — Ambulatory Visit (HOSPITAL_COMMUNITY): Admission: RE | Admit: 2015-09-15 | Payer: BLUE CROSS/BLUE SHIELD | Source: Ambulatory Visit

## 2016-07-11 ENCOUNTER — Emergency Department (INDEPENDENT_AMBULATORY_CARE_PROVIDER_SITE_OTHER)
Admission: EM | Admit: 2016-07-11 | Discharge: 2016-07-11 | Disposition: A | Discharge status: Home / Self Care | Payer: BLUE CROSS/BLUE SHIELD | Source: Home / Self Care | Attending: Family Medicine | Admitting: Family Medicine

## 2016-07-11 DIAGNOSIS — B9789 Other viral agents as the cause of diseases classified elsewhere: Secondary | ICD-10-CM | POA: Diagnosis not present

## 2016-07-11 DIAGNOSIS — J069 Acute upper respiratory infection, unspecified: Secondary | ICD-10-CM | POA: Diagnosis not present

## 2016-07-11 DIAGNOSIS — J4520 Mild intermittent asthma, uncomplicated: Secondary | ICD-10-CM

## 2016-07-11 DIAGNOSIS — J302 Other seasonal allergic rhinitis: Secondary | ICD-10-CM

## 2016-07-11 MED ORDER — ALBUTEROL SULFATE HFA 108 (90 BASE) MCG/ACT IN AERS: 1.0000 | INHALATION_SPRAY | Freq: Four times a day (QID) | RESPIRATORY_TRACT | 0 refills | Status: DC | PRN

## 2016-07-11 MED ORDER — PREDNISONE 20 MG PO TABS: ORAL_TABLET | ORAL | 0 refills | Status: DC

## 2016-07-11 MED ORDER — IPRATROPIUM BROMIDE 0.06 % NA SOLN: 2.0000 | Freq: Four times a day (QID) | NASAL | 2 refills | Status: DC

## 2016-07-11 MED ORDER — AZITHROMYCIN 250 MG PO TABS: 250.0000 mg | ORAL_TABLET | Freq: Every day | ORAL | 0 refills | Status: DC

## 2016-07-11 NOTE — ED Provider Notes (Signed)
CSN: [id number]     Arrival date & time 07/11/16  0914 History   First MD Initiated Contact with Patient 07/11/16 [phone]     Chief Complaint  Patient presents with  . Cough   (Consider location/radiation/quality/duration/timing/severity/associated sxs/prior Treatment) HPI Aaron Woodward is a [age] y.o. male presenting to UC with c/o 1-2 weeks of nasal congestion that he thought were allergies so he took Claritin D intermittently.  3 days ago he developed sneezing and a frontal headache.  Two days ago he developed coughing with burning in his lungs.  Cough kept him awake last night.  Denies hx of asthma.  He notes he was able to do his daily run, close to 14 miles, yesterday but was worn out the rest of the evening. No hx of asthma but he has had pneumonia in the past. Denies fever, chills, n/v/d.    Past Medical History:  Diagnosis Date  . History of pneumonia    Past Surgical History:  Procedure Laterality Date  . HERNIA REPAIR    . TESTICLE SURGERY     Family History  Problem Relation Age of Onset  . COPD Mother   . Diabetes Mother    Social History  Substance Use Topics  . Smoking status: Never Smoker  . Smokeless tobacco: Never Used  . Alcohol use No    Review of Systems  Constitutional: Positive for fatigue. Negative for chills and fever.  HENT: Positive for congestion, postnasal drip, rhinorrhea and sneezing. Negative for ear pain, sore throat, trouble swallowing and voice change.   Respiratory: Positive for cough. Negative for shortness of breath.   Cardiovascular: Negative for chest pain and palpitations.  Gastrointestinal: Negative for abdominal pain, diarrhea, nausea and vomiting.  Musculoskeletal: Negative for arthralgias, back pain and myalgias.  Skin: Negative for rash.  Neurological: Positive for headaches. Negative for dizziness and light-headedness.    Allergies  Patient has no known allergies.  Home Medications   Prior to Admission medications    Medication Sig Start Date End Date Taking? Authorizing Provider  albuterol (PROVENTIL HFA;VENTOLIN HFA) 108 (90 Base) MCG/ACT inhaler Inhale 1-2 puffs into the lungs every 6 (six) hours as needed for wheezing or shortness of breath. 07/11/16   Junius Finner'Malley, , PA-C  azithromycin (ZITHROMAX) 250 MG tablet Take 1 tablet (250 mg total) by mouth daily. Take first 2 tablets together, then 1 every day until finished. 07/11/16   Junius Finner'Malley, , PA-C  benzonatate (TESSALON) 100 MG capsule Take 1 capsule (100 mg total) by mouth every 8 (eight) hours. 01/13/15   Junius Finner'Malley, , PA-C  dextromethorphan-guaiFENesin [facility](MUCINEX DM) 30-600 MG 12hr tablet Take 1 tablet by mouth 2 (two) times daily. Take with a large glass of water 01/13/15   Junius Finner'Malley, , PA-C  fluticasone [facility](FLONASE) 50 MCG/ACT nasal spray Place 2 sprays into both nostrils daily. 02/13/15   Junius Finner'Malley, , PA-C  ipratropium (ATROVENT) 0.06 % nasal spray Place 2 sprays into both nostrils 4 (four) times daily. 07/11/16   Junius Finner'Malley, , PA-C  montelukast (SINGULAIR) 10 MG tablet Take 1 tablet (10 mg total) by mouth at bedtime. 02/13/15   Junius Finner'Malley, , PA-C  predniSONE (DELTASONE) 20 MG tablet 3 tabs po day one, then 2 po daily x 4 days 07/11/16   Junius Finner'Malley, , PA-C   Meds Ordered and Administered this Visit  Medications - No data to display  BP 110/74 (BP Location: Left Arm)   Pulse 68   Temp 99.1 F (37.3 C) (Oral)   Ht 5\' 8"  (  1.727 m)   Wt 173 lb (78.5 kg)   SpO2 99%   BMI 26.30 kg/m  No data found.   Physical Exam  Constitutional: He is oriented to person, place, and time. He appears well-developed and well-nourished. No distress.  HENT:  Head: Normocephalic and atraumatic.  Right Ear: Tympanic membrane normal.  Left Ear: Tympanic membrane normal.  Nose: Mucosal edema present. Right sinus exhibits maxillary sinus tenderness and frontal sinus tenderness. Left sinus exhibits maxillary sinus tenderness and frontal sinus tenderness.   Mouth/Throat: Uvula is midline, oropharynx is clear and moist and mucous membranes are normal.  Eyes: EOM are normal.  Neck: Normal range of motion. Neck supple.  Cardiovascular: Normal rate and regular rhythm.   Pulmonary/Chest: Effort normal. No stridor. No respiratory distress. He has wheezes. He has no rales.  Musculoskeletal: Normal range of motion.  Lymphadenopathy:    He has no cervical adenopathy.  Neurological: He is alert and oriented to person, place, and time.  Skin: Skin is warm and dry. He is not diaphoretic.  Psychiatric: He has a normal mood and affect. His behavior is normal.  Nursing note and vitals reviewed.   Urgent Care Course     Procedures (including critical care time)  Labs Review Labs Reviewed - No data to display  Imaging Review No results found.   MDM   1. Viral URI with cough   2. Mild intermittent reactive airway disease without complication   3. Seasonal allergic rhinitis, unspecified trigger    Congestion and wheeze noted on exam. Temp of 99.1*F in UC Symptoms likely viral in nature. Encouraged symptomatic treatment first. Rx: Prednisone, Ipratropium nasal spray, albuterol inhaler. Prescription to hold with expiration date for azithromycin. Pt to fill if persistent fever develops or not improving in 1 week.  f/u with PCP as needed.   Junius Finner, PA-C 07/11/16 1118

## 2016-07-11 NOTE — Discharge Instructions (Signed)
°  Your symptoms are likely due to a virus such as the common cold, however, if you developing worsening chest congestion with shortness of breath, persistent fever (> 100.4*F) for 3 days, or symptoms not improving in 4-5 days, you may fill the antibiotic (azithromycin).  If you do fill the antibiotic,  please take antibiotics as prescribed and be sure to complete entire course even if you start to feel better to ensure infection does not come back. ° °

## 2016-07-11 NOTE — ED Triage Notes (Signed)
Has been going on for several days, and thought it was allergies, took a claritin D.  Saturday had headache and sneezing.  Last 2 days, also coughing and burning in his lungs.  Coughing kept him awake last night.

## 2016-08-11 IMAGING — CR DG CHEST 2V
2 series · 2 of 2 positions shown · non-contrast
Comparison: June 09, 2014

CLINICAL DATA: Cough for 5 weeks

EXAM:
CHEST  2 VIEW

[chest pa]
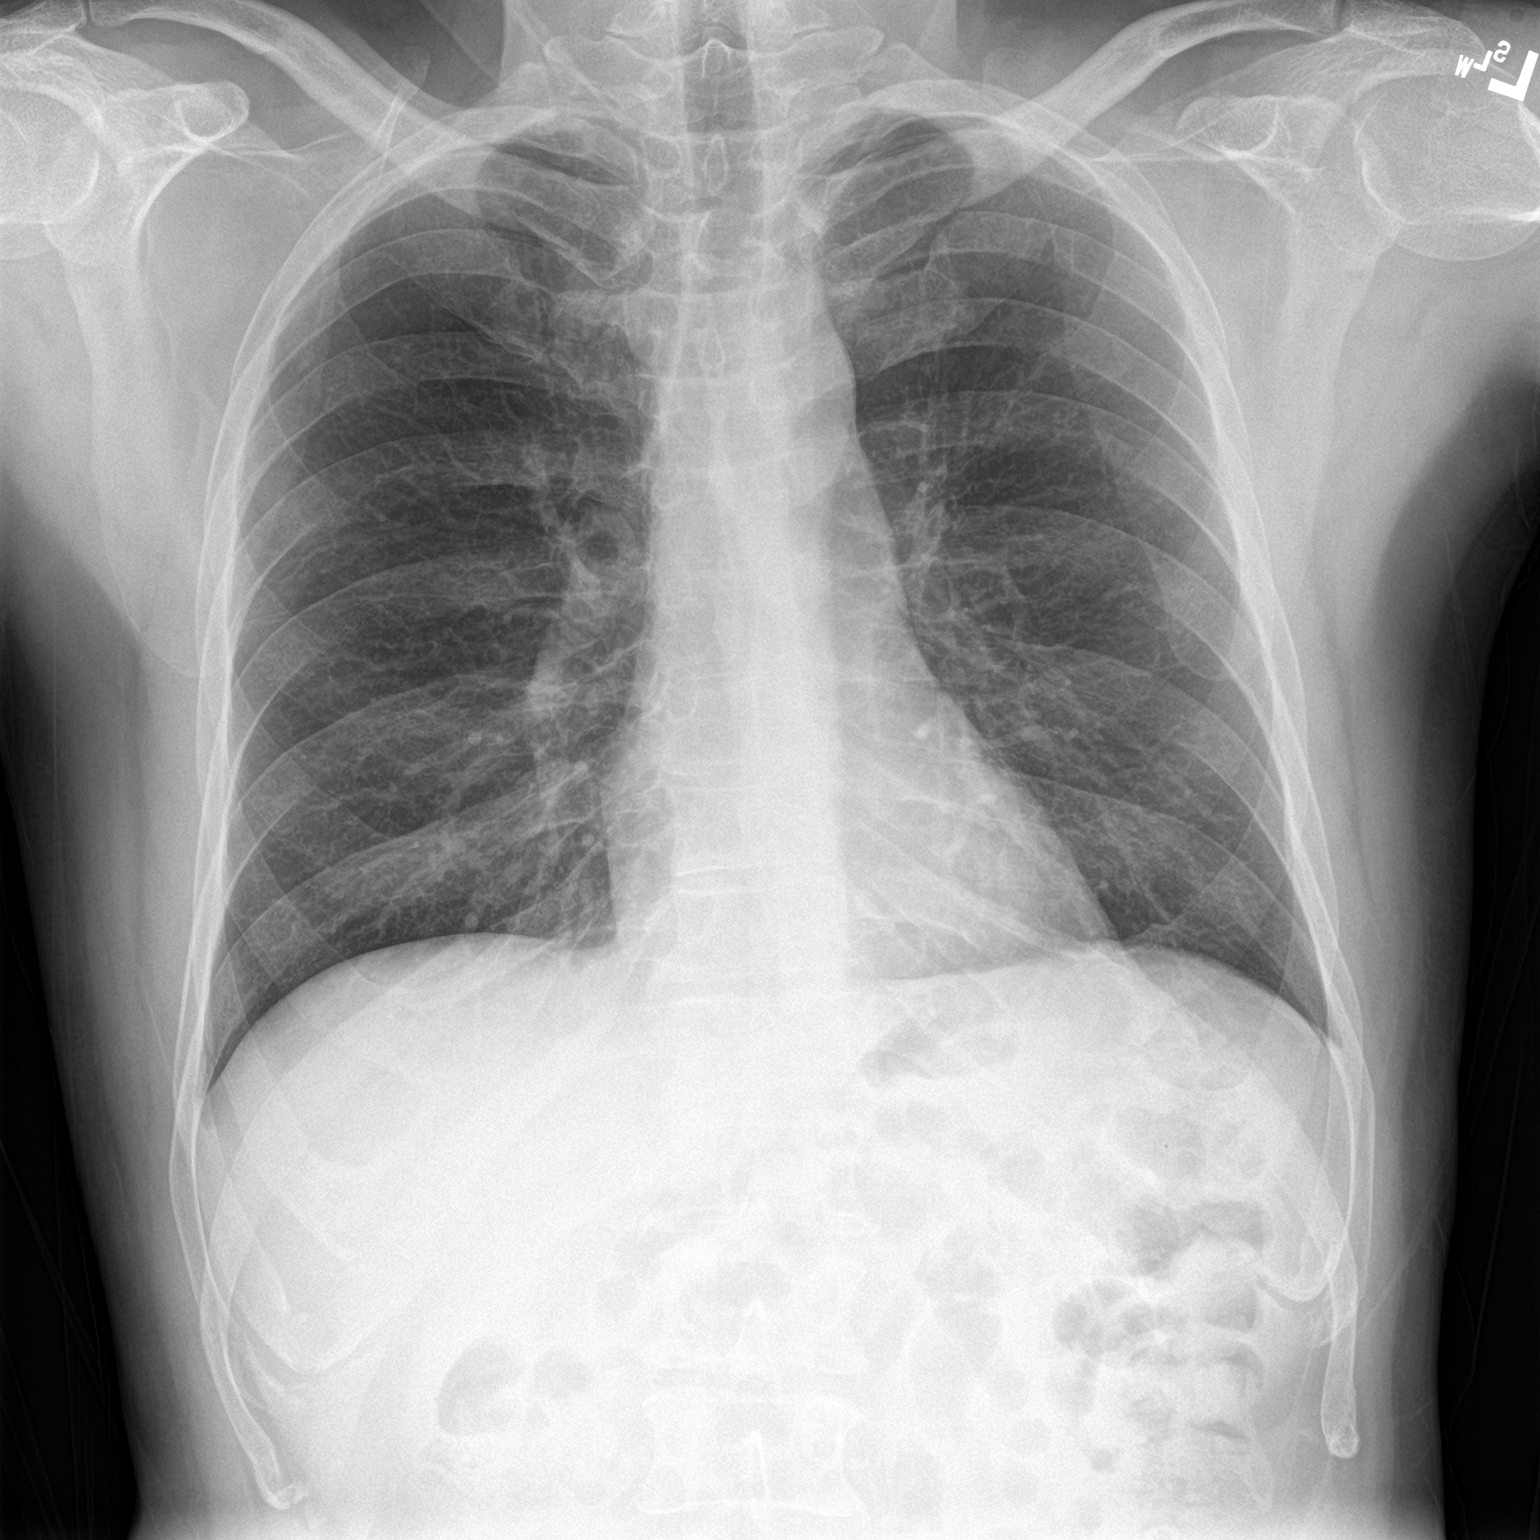

[chest lat]
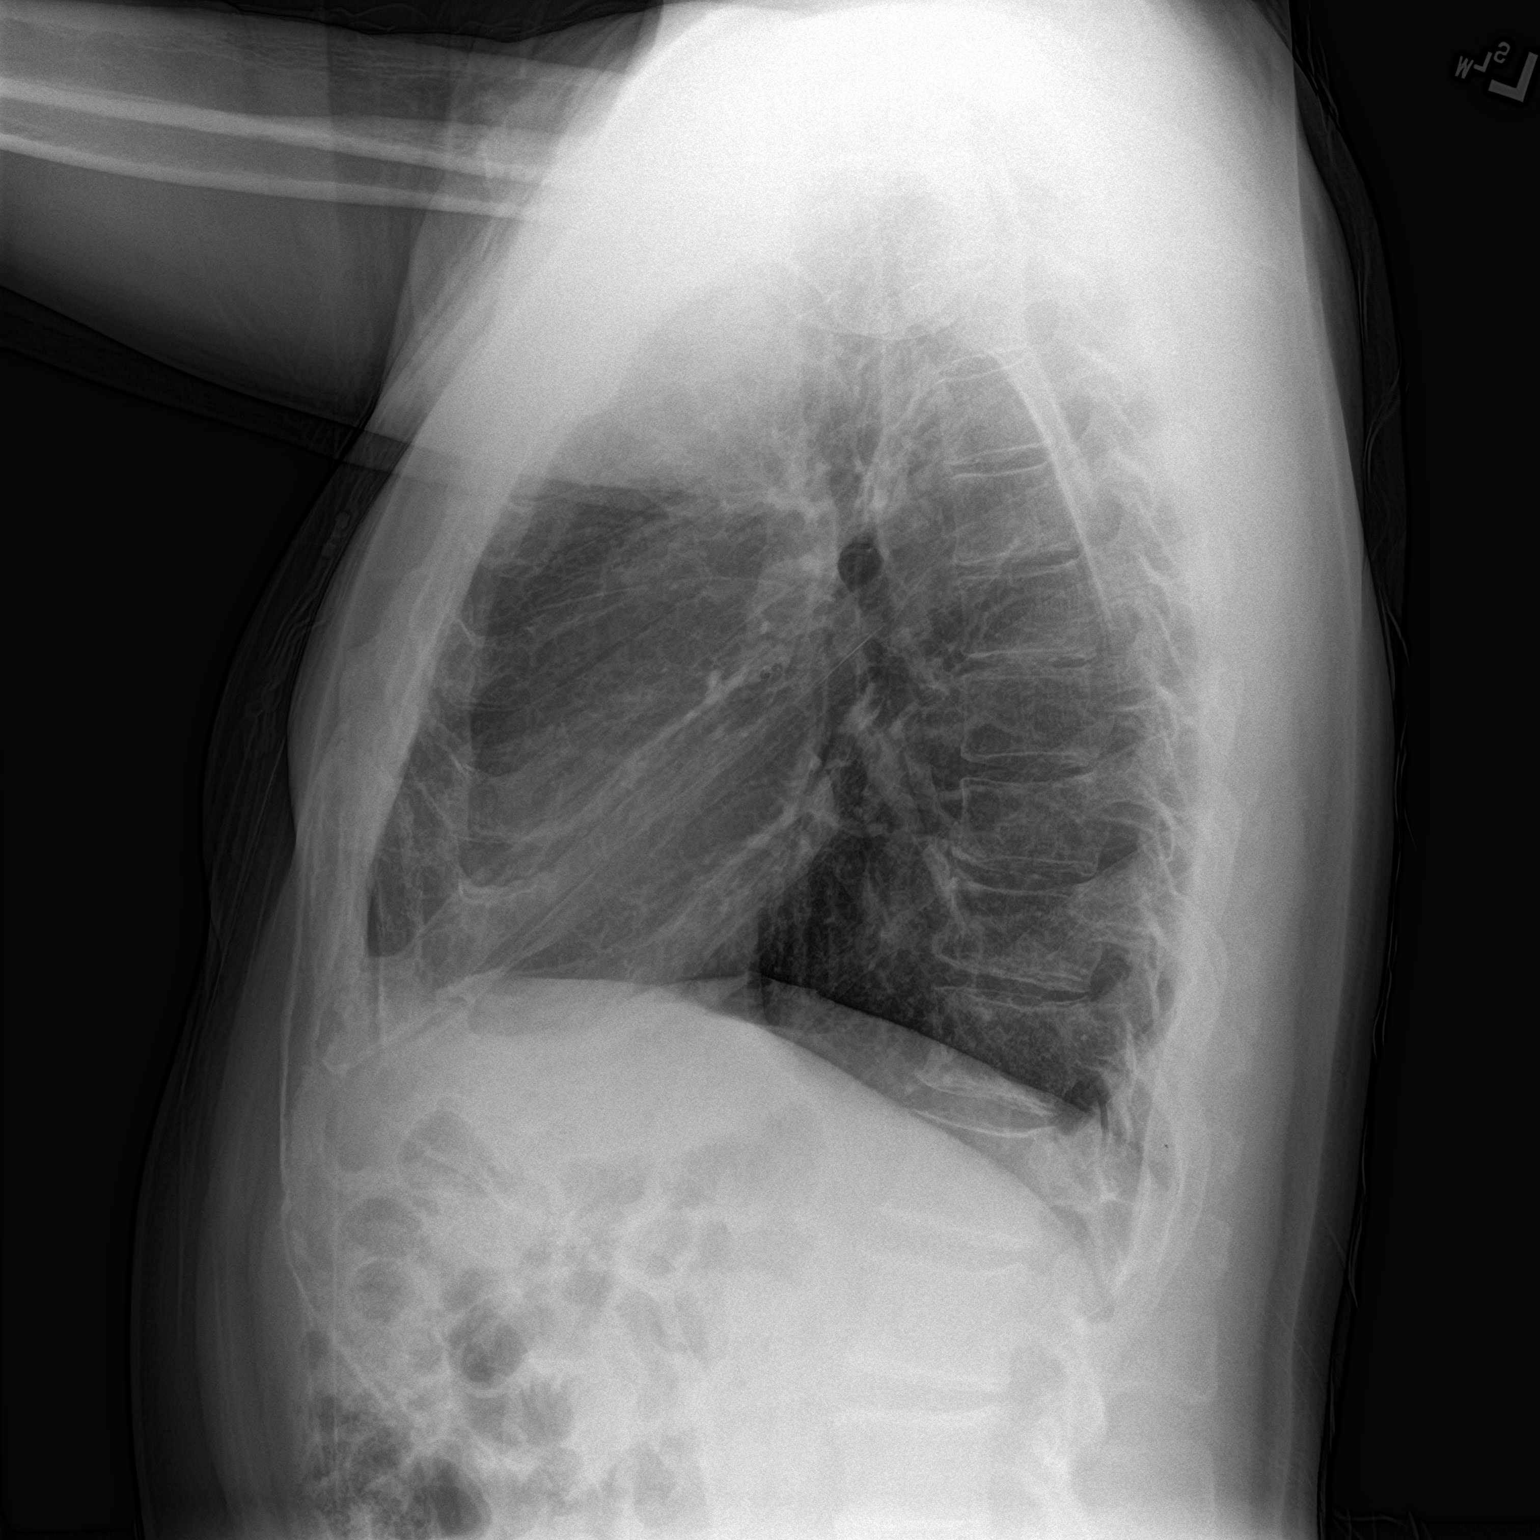

[2 of 2 positions shown; findings below may reference images not displayed]

FINDINGS: There is no edema or consolidation. The heart size and pulmonary
vascularity are normal. No adenopathy. No bone lesions.
IMPRESSION: No edema or consolidation.

## 2019-08-23 ENCOUNTER — Other Ambulatory Visit: Payer: Self-pay | Admitting: Urology

## 2019-08-23 DIAGNOSIS — R972 Elevated prostate specific antigen [PSA]: Secondary | ICD-10-CM

## 2019-09-28 ENCOUNTER — Ambulatory Visit
Admission: RE | Admit: 2019-09-28 | Discharge: 2019-09-28 | Disposition: A | Discharge status: Home / Self Care | Payer: BLUE CROSS/BLUE SHIELD | Source: Ambulatory Visit | Attending: Urology | Admitting: Urology

## 2019-09-28 DIAGNOSIS — R972 Elevated prostate specific antigen [PSA]: Secondary | ICD-10-CM

## 2019-09-28 MED ORDER — GADOBENATE DIMEGLUMINE 529 MG/ML IV SOLN
16.0000 mL | Freq: Once | INTRAVENOUS | Status: AC | PRN
  Administered 2019-09-28: 16 mL via INTRAVENOUS

## 2020-01-08 ENCOUNTER — Other Ambulatory Visit: Payer: Self-pay

## 2020-01-08 ENCOUNTER — Emergency Department (INDEPENDENT_AMBULATORY_CARE_PROVIDER_SITE_OTHER)
Admission: EM | Admit: 2020-01-08 | Discharge: 2020-01-08 | Disposition: A | Discharge status: Home / Self Care | Payer: BC Managed Care – PPO | Source: Home / Self Care | Attending: Family Medicine | Admitting: Family Medicine

## 2020-01-08 DIAGNOSIS — Z9189 Other specified personal risk factors, not elsewhere classified: Secondary | ICD-10-CM | POA: Diagnosis not present

## 2020-01-08 DIAGNOSIS — J069 Acute upper respiratory infection, unspecified: Secondary | ICD-10-CM

## 2020-01-08 MED ORDER — AZITHROMYCIN 250 MG PO TABS: 250.0000 mg | ORAL_TABLET | Freq: Every day | ORAL | 0 refills | Status: DC

## 2020-01-08 MED ORDER — PREDNISONE 20 MG PO TABS: ORAL_TABLET | ORAL | 0 refills | Status: DC

## 2020-01-08 NOTE — ED Provider Notes (Signed)
[facility]    CSN: [id number] Arrival date & time: 01/08/20  8099      History   Chief Complaint Chief Complaint  Patient presents with  . Cough    HPI Aaron Woodward is a 59 y.o. male.   Three days ago patient developed nasal congestion, myalgias, headache, low grade fever, and non-productive cough.  He denies pleuritic pain and shortness of breath. He has a past history of five episodes of pneumonia.  The history is provided by the patient.    Past Medical History:  Diagnosis Date  . History of pneumonia     Patient Active Problem List   Diagnosis Date Noted  . History of pneumonia     Past Surgical History:  Procedure Laterality Date  . HERNIA REPAIR    . TESTICLE SURGERY         Home Medications    Prior to Admission medications   Medication Sig Start Date End Date Taking? Authorizing Provider  tamsulosin (FLOMAX) 0.4 MG CAPS capsule Take 0.4 mg by mouth in the morning and at bedtime.   Yes [provider]  albuterol (PROVENTIL HFA;VENTOLIN HFA) 108 (90 Base) MCG/ACT inhaler Inhale 1-2 puffs into the lungs every 6 (six) hours as needed for wheezing or shortness of breath. 07/11/16   Lurene Shadow, PA-C  azithromycin (ZITHROMAX) 250 MG tablet Take 1 tablet (250 mg total) by mouth daily. Take first 2 tablets together, then 1 every day until finished. 01/08/20   Lattie Haw, MD  benzonatate (TESSALON) 100 MG capsule Take 1 capsule (100 mg total) by mouth every 8 (eight) hours. 01/13/15   Lurene Shadow, PA-C  dextromethorphan-guaiFENesin (MUCINEX DM) 30-600 MG 12hr tablet Take 1 tablet by mouth 2 (two) times daily. Take with a large glass of water 01/13/15   Waylan Rocher O, PA-C  fluticasone [facility]) 50 MCG/ACT nasal spray Place 2 sprays into both nostrils daily. 02/13/15   Lurene Shadow, PA-C  ipratropium (ATROVENT) 0.06 % nasal spray Place 2 sprays into both nostrils 4 (four) times daily. 07/11/16   Lurene Shadow, PA-C    montelukast (SINGULAIR) 10 MG tablet Take 1 tablet (10 mg total) by mouth at bedtime. 02/13/15   Lurene Shadow, PA-C  predniSONE (DELTASONE) 20 MG tablet Take one tab by mouth twice daily for 4 days, then one daily for 3 days. Take with food. 01/08/20   Lattie Haw, MD    Family History Family History  Problem Relation Age of Onset  . COPD Mother   . Diabetes Mother     Social History Social History   Tobacco Use  . Smoking status: Never Smoker  . Smokeless tobacco: Never Used  Substance Use Topics  . Alcohol use: Yes    Comment: rarely  . Drug use: No     Allergies   Patient has no known allergies.   Review of Systems Review of Systems  No sore throat + cough + pleuritic pain No wheezing + nasal congestion + post-nasal drainage No sinus pain/pressure No itchy/red eyes No earache No hemoptysis No SOB + low grade fever, + chills No nausea No vomiting No abdominal pain No diarrhea No urinary symptoms No skin rash + fatigue + myalgias + headache Used OTC meds (Nyquil, Mucinex DM) without relief    Physical Exam Triage Vital Signs ED Triage Vitals  Enc Vitals Group     BP 01/08/20 0836 111/67     Pulse Rate 01/08/20  0836 73     Resp 01/08/20 0836 18     Temp 01/08/20 0836 99.4 F (37.4 C)     Temp Source 01/08/20 0836 Oral     SpO2 01/08/20 0836 97 %     Weight --      Height --      Head Circumference --      Peak Flow --      Pain Score 01/08/20 0830 0     Pain Loc --      Pain Edu? --      Excl. in GC? --    No data found.  Updated Vital Signs BP 111/67 (BP Location: Right Arm)   Pulse 73   Temp 99.4 F (37.4 C) (Oral)   Resp 18   SpO2 97%   Visual Acuity Right Eye Distance:   Left Eye Distance:   Bilateral Distance:    Right Eye Near:   Left Eye Near:    Bilateral Near:     Physical Exam Nursing notes and Vital Signs reviewed. Appearance:  Patient appears stated age, and in no acute distress Eyes:  Pupils are  equal, round, and reactive to light and accomodation.  Extraocular movement is intact.  Conjunctivae are not inflamed  Ears:  Canals normal.  Tympanic membranes normal.  Nose:  Mildly congested turbinates.  No sinus tenderness.  Pharynx:  Normal Neck:  Supple.  Mildly enlarged nontender lateral nodes are present. Lungs:  Clear to auscultation.  Breath sounds are equal.  Moving air well. Heart:  Regular rate and rhythm without murmurs, rubs, or gallops.  Abdomen:  Nontender without masses or hepatosplenomegaly.  Bowel sounds are present.  No CVA or flank tenderness.  Extremities:  No edema.  Skin:  No rash present.   UC Treatments / Results  Labs (all labs ordered are listed, but only abnormal results are displayed) Labs Reviewed  NOVEL CORONAVIRUS, NAA    EKG   Radiology No results found.  Procedures Procedures (including critical care time)  Medications Ordered in UC Medications - No data to display  Initial Impression / Assessment and Plan / UC Course  I have reviewed the triage vital signs and the nursing notes.  Pertinent labs & imaging results that were available during my care of the patient were reviewed by me and considered in my medical decision making (see chart for details).    There is no evidence of bacterial infection today.  Treat symptomatically for now.  Begin prednisone burst/taper. Followup with Family Doctor if not improved in about 10 days.   Final Clinical Impressions(s) / UC Diagnoses   Final diagnoses:  At increased risk of exposure to COVID-19 virus  Viral URI with cough     Discharge Instructions     Take plain guaifenesin (1200mg  extended release tabs such as Mucinex) twice daily, with plenty of water, for cough and congestion.  May add Pseudoephedrine (30mg , one or two every 4 to 6 hours) for sinus congestion.  Get adequate rest.   May use Afrin nasal spray (or generic oxymetazoline) each morning for about 5 days and then discontinue.   Also recommend using saline nasal spray several times daily and saline nasal irrigation (AYR is a common brand).  Use Flonase nasal spray each morning after using Afrin nasal spray and saline nasal irrigation. Try warm salt water gargles for sore throat.  Stop all antihistamines for now, and other non-prescription cough/cold preparations. May take Delsym Cough Suppressant ("12 Hour Cough Relief")  at bedtime for nighttime cough.  Begin Azithromycin if not improving about one week or if persistent fever develops  (Given a prescription to hold, with an expiration date)       ED Prescriptions    Medication Sig Dispense Auth. Provider   azithromycin (ZITHROMAX) 250 MG tablet Take 1 tablet (250 mg total) by mouth daily. Take first 2 tablets together, then 1 every day until finished. 6 tablet Lattie Haw, MD   predniSONE (DELTASONE) 20 MG tablet Take one tab by mouth twice daily for 4 days, then one daily for 3 days. Take with food. 11 tablet Lattie Haw, MD        Lattie Haw, MD 01/12/20 [phone]

## 2020-01-08 NOTE — Discharge Instructions (Addendum)
Take plain guaifenesin (1200mg extended release tabs such as Mucinex) twice daily, with plenty of water, for cough and congestion.  May add Pseudoephedrine (30mg, one or two every 4 to 6 hours) for sinus congestion.  Get adequate rest.   May use Afrin nasal spray (or generic oxymetazoline) each morning for about 5 days and then discontinue.  Also recommend using saline nasal spray several times daily and saline nasal irrigation (AYR is a common brand).  Use Flonase nasal spray each morning after using Afrin nasal spray and saline nasal irrigation. Try warm salt water gargles for sore throat.  Stop all antihistamines for now, and other non-prescription cough/cold preparations. May take Delsym Cough Suppressant ("12 Hour Cough Relief") at bedtime for nighttime cough.  Begin Azithromycin if not improving about one week or if persistent fever develops  

## 2020-01-08 NOTE — ED Triage Notes (Signed)
Pt presents to Urgent Care with c/o cough and nasal congestion x 3 days. Reports that he received his COVID booster 4 days ago.   Pt w/ known COVID exposure 3 weeks ago; was tested following this and had negative result.

## 2020-01-10 LAB — SARS-COV-2, NAA 2 DAY TAT

## 2020-01-10 LAB — NOVEL CORONAVIRUS, NAA: SARS-CoV-2, NAA: NOT DETECTED

## 2022-09-06 ENCOUNTER — Encounter: Payer: Self-pay | Admitting: Urology

## 2022-09-06 ENCOUNTER — Ambulatory Visit (INDEPENDENT_AMBULATORY_CARE_PROVIDER_SITE_OTHER): Payer: 59 | Admitting: Urology

## 2022-09-06 ENCOUNTER — Other Ambulatory Visit: Payer: Self-pay | Admitting: Urology

## 2022-09-06 VITALS — BP 114/71 | HR 61 | Ht 68.5 in | Wt 169.4 lb

## 2022-09-06 DIAGNOSIS — N401 Enlarged prostate with lower urinary tract symptoms: Secondary | ICD-10-CM

## 2022-09-06 DIAGNOSIS — R310 Gross hematuria: Secondary | ICD-10-CM

## 2022-09-06 DIAGNOSIS — R350 Frequency of micturition: Secondary | ICD-10-CM | POA: Diagnosis not present

## 2022-09-06 LAB — BLADDER SCAN AMB NON-IMAGING

## 2022-09-06 NOTE — Progress Notes (Signed)
09/06/22 8:59 AM   Aaron Woodward Apr 13, 1960 [id number]  CC: BPH/LUTS, gross hematuria, elevated PSA  HPI: 62 year old male referred from Dr. Retta Diones for BPH/LUTS and gross hematuria, and consideration of HOLEP.  He is a 61 year old male with a long history of 5 to 10 years of worsening urinary symptoms with weak stream, frequency, he performs CIC 1-2 times per month.  Also reports a recent episode a few weeks ago of significant gross hematuria for few days, required catheterization 3-4X/week at that time.  IPSS score today is 18, PVR 95ml.  He is interested in surgical options for his BPH.  He also has a long history of elevated PSA of approximately 16, underwent prostate MRI in 2021 showing a 170 g prostate with no suspicious lesions, also has a history of a negative prostate biopsy.   PMH: Past Medical History:  Diagnosis Date   History of pneumonia     Surgical History: Past Surgical History:  Procedure Laterality Date   HERNIA REPAIR     TESTICLE SURGERY      Family History: Family History  Problem Relation Age of Onset   COPD Mother    Diabetes Mother     Social History:  reports that he has never smoked. He has never used smokeless tobacco. He reports current alcohol use. He reports that he does not use drugs.  Physical Exam: BP 114/71 (BP Location: Left Arm, Patient Position: Sitting, Cuff Size: Normal)   Pulse 61   Ht 5' 8.5" (1.74 m)   Wt 169 lb 6.4 oz (76.8 kg)   BMI 25.38 kg/m    Constitutional:  Alert and oriented, No acute distress. Cardiovascular: No clubbing, cyanosis, or edema. Respiratory: Normal respiratory effort, no increased work of breathing. GI: Abdomen is soft, nontender, nondistended, no abdominal masses   Laboratory Data: Reviewed, see HPI  Pertinent Imaging: I have personally viewed and interpreted the prostate MRI from 2021 with no suspicious lesions, prostate measures 170g.  Assessment & Plan:   62 year old male with long  history of BPH with worsening urinary symptoms, intermittent retention requiring catheterization 1-2 times per month, recent gross hematuria for 2 to 3 weeks that has since resolved, long history of elevated PSA with negative prostate MRI and negative biopsy, prostate volume 170 g from MRI 2021.  We discussed the risks and benefits of HoLEP at length.  The procedure requires general anesthesia and takes 1 to 2 hours, and a holmium laser is used to enucleate the prostate and push this tissue into the bladder.  A morcellator is then used to remove this tissue, which is sent for pathology.  The vast majority(>95%) of patients are able to discharge the same day with a catheter in place for 2 to 3 days, and will follow-up in clinic for a voiding trial.  We specifically discussed the risks of bleeding, infection, retrograde ejaculation, temporary urgency and urge incontinence, very low risk of long-term incontinence, urethral stricture/bladder neck contracture, pathologic evaluation of prostate tissue and possible detection of prostate cancer or other malignancy, and possible need for additional procedures.  We discussed alternatives like prostatic artery embolization or robotic simple prostatectomy.  We discussed common possible etiologies of hematuria including BPH, malignancy, urolithiasis, medical renal disease, and idiopathic. Standard workup recommended by the AUA includes imaging with CT urogram to assess the upper tracts, and cystoscopy. Cytology is performed on patient's with gross hematuria to look for malignant cells in the urine.  CT hematuria for recent gross hematuria, defers cystoscopy  until time of HOLEP which I think is reasonable, call with results Schedule HoLEP(will reevaluate prostate volume on upcoming CT   Legrand Rams, MD 09/06/2022  [facility] [address] [place], [place] [address] [phone]

## 2022-09-06 NOTE — Progress Notes (Signed)
Surgical Physician Order Form [facility] [place]  * Scheduling expectation : Next Available (CT needs to be completed prior to surgery, ordered)  *Length of Case: 2 hours  *Clearance needed: no  *Anticoagulation Instructions: Hold all anticoagulants  *Aspirin Instructions: Hold Aspirin  *Post-op visit Date/Instructions:   Patient will self remove Foley 2 days after surgery, RTC with me 3-4 months PVR  *Diagnosis: BPH w/urinary obstruction  *Procedure:     HOLEP (40981)   Additional orders: N/A  -Admit type: OUTpatient  -Anesthesia: General  -VTE Prophylaxis Standing Order SCD's       Other:   -Standing Lab Orders Per Anesthesia    Lab other: UA&Urine Culture  -Standing Test orders EKG/Chest x-ray per Anesthesia       Test other:   - Medications:  Ancef 2gm IV  -Other orders:  N/A

## 2022-09-06 NOTE — Patient Instructions (Signed)

## 2022-09-13 ENCOUNTER — Ambulatory Visit: Payer: Self-pay | Admitting: Urology

## 2022-09-19 ENCOUNTER — Telehealth: Payer: Self-pay

## 2022-09-19 NOTE — Progress Notes (Signed)
    [facility] Surgical Posting Form  Surgery Date: Date: 10/14/2022  Surgeon: Dr. 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

## 2022-09-19 NOTE — Telephone Encounter (Signed)
I spoke with Aaron Woodward. We have discussed possible surgery dates and Friday August 16th, 2024 was agreed upon by all parties. Patient given information about surgery date, what to expect pre-operatively and post operatively.  We discussed that a Pre-Admission Testing office will be calling to set up the pre-op visit that will take place prior to surgery, and that these appointments are typically done over the phone with a Pre-Admissions RN. Informed patient that our office will communicate any additional care to be provided after surgery. Patients questions or concerns were discussed during our call. Advised to call our office should there be any additional information, questions or concerns that arise. Patient verbalized understanding.

## 2022-09-29 ENCOUNTER — Other Ambulatory Visit: Payer: 59

## 2022-09-29 DIAGNOSIS — N138 Other obstructive and reflux uropathy: Secondary | ICD-10-CM

## 2022-09-29 DIAGNOSIS — N401 Enlarged prostate with lower urinary tract symptoms: Secondary | ICD-10-CM

## 2022-09-29 HISTORY — DX: Other obstructive and reflux uropathy: N13.8

## 2022-09-29 HISTORY — DX: Other obstructive and reflux uropathy: N40.1

## 2022-10-05 ENCOUNTER — Encounter
Admission: RE | Admit: 2022-10-05 | Discharge: 2022-10-05 | Disposition: A | Discharge status: Home / Self Care | Payer: 59 | Source: Ambulatory Visit | Attending: Urology | Admitting: Urology

## 2022-10-05 VITALS — Ht 68.5 in | Wt 173.0 lb

## 2022-10-05 DIAGNOSIS — Z01812 Encounter for preprocedural laboratory examination: Secondary | ICD-10-CM

## 2022-10-05 DIAGNOSIS — N401 Enlarged prostate with lower urinary tract symptoms: Secondary | ICD-10-CM

## 2022-10-05 NOTE — Patient Instructions (Addendum)
Your procedure is scheduled on: Friday, August 16 Report to the Registration Desk on the 1st floor of the [facility] To find out your arrival time, please call [phone] between 1PM - 3PM on: Thursday, August 15 If your arrival time is 6:00 am, do not arrive before that time as the [facility] entrance doors do not open until 6:00 am.  REMEMBER: Instructions that are not followed completely may result in serious medical risk, up to and including death; or upon the discretion of your surgeon and anesthesiologist your surgery may need to be rescheduled.  Do not eat or drink after midnight the night before surgery.  No gum chewing or hard candies.  One week prior to surgery: starting August 9 Stop Anti-inflammatories (NSAIDS) such as Advil, Aleve, Ibuprofen, Motrin, Naproxen, Naprosyn and Aspirin based products such as Excedrin, Goody's Powder, BC Powder. Stop ANY OVER THE COUNTER supplements until after surgery. You may however, continue to take Tylenol if needed for pain up until the day of surgery.  Continue taking all prescribed medications   TAKE ONLY THESE MEDICATIONS THE MORNING OF SURGERY WITH A SIP OF WATER:  Tamsulosin (Flomax)  No Alcohol for 24 hours before or after surgery.  No Smoking including e-cigarettes for 24 hours before surgery.  No chewable tobacco products for at least 6 hours before surgery.  No nicotine patches on the day of surgery.  Do not use any "recreational" drugs for at least a week (preferably 2 weeks) before your surgery.  Please be advised that the combination of cocaine and anesthesia may have negative outcomes, up to and including death. If you test positive for cocaine, your surgery will be cancelled.  On the morning of surgery brush your teeth with toothpaste and water, you may rinse your mouth with mouthwash if you wish. Do not swallow any toothpaste or mouthwash.  Do not wear jewelry, make-up, hairpins, clips or nail polish.  Do not  wear lotions, powders, or perfumes.   Do not shave body hair from the neck down 48 hours before surgery.  Contact lenses, hearing aids and dentures may not be worn into surgery.  Do not bring valuables to the hospital. [facility] is not responsible for any missing/lost belongings or valuables.   Notify your doctor if there is any change in your medical condition (cold, fever, infection).  Wear comfortable clothing (specific to your surgery type) to the hospital.  After surgery, you can help prevent lung complications by doing breathing exercises.  Take deep breaths and cough every 1-2 hours.   If you are being discharged the day of surgery, you will not be allowed to drive home. You will need a responsible individual to drive you home and stay with you for 24 hours after surgery.   If you are taking public transportation, you will need to have a responsible individual with you.  Please call the Pre-admissions Testing Dept. at [phone] if you have any questions about these instructions.  Surgery Visitation Policy:  Patients having surgery or a procedure may have two visitors.  Children under the age of 59 must have an adult with them who is not the patient.

## 2022-10-11 ENCOUNTER — Ambulatory Visit (INDEPENDENT_AMBULATORY_CARE_PROVIDER_SITE_OTHER): Payer: 59

## 2022-10-11 DIAGNOSIS — R350 Frequency of micturition: Secondary | ICD-10-CM

## 2022-10-11 DIAGNOSIS — N401 Enlarged prostate with lower urinary tract symptoms: Secondary | ICD-10-CM | POA: Diagnosis not present

## 2022-10-11 DIAGNOSIS — R310 Gross hematuria: Secondary | ICD-10-CM

## 2022-10-11 MED ORDER — IOHEXOL 300 MG/ML  SOLN
200.0000 mL | Freq: Once | INTRAMUSCULAR | Status: AC | PRN
  Administered 2022-10-11: 125 mL via INTRAVENOUS

## 2022-10-12 ENCOUNTER — Other Ambulatory Visit: Payer: Self-pay

## 2022-10-12 ENCOUNTER — Encounter: Payer: Self-pay | Admitting: Urology

## 2022-10-12 ENCOUNTER — Other Ambulatory Visit: Payer: 59

## 2022-10-12 DIAGNOSIS — N138 Other obstructive and reflux uropathy: Secondary | ICD-10-CM

## 2022-10-13 LAB — MICROSCOPIC EXAMINATION

## 2022-10-13 LAB — URINALYSIS, ROUTINE W REFLEX MICROSCOPIC
Bilirubin, UA: NEGATIVE
Glucose, UA: NEGATIVE
Ketones, UA: NEGATIVE
Nitrite, UA: NEGATIVE
Protein,UA: NEGATIVE
Specific Gravity, UA: 1.015 (ref 1.005–1.030)
Urobilinogen, Ur: 0.2 mg/dL (ref 0.2–1.0)
pH, UA: 5.5 (ref 5.0–7.5)

## 2022-10-14 ENCOUNTER — Encounter: Admission: RE | Payer: Self-pay | Source: Ambulatory Visit

## 2022-10-14 ENCOUNTER — Ambulatory Visit: Admission: RE | Admit: 2022-10-14 | Payer: 59 | Source: Ambulatory Visit | Admitting: Urology

## 2022-10-14 LAB — URINE CULTURE: Organism ID, Bacteria: NO GROWTH

## 2022-10-14 SURGERY — ENUCLEATION, PROSTATE, USING LASER, WITH MORCELLATION
Anesthesia: General

## 2023-01-12 ENCOUNTER — Ambulatory Visit: Payer: 59 | Admitting: Urology

## 2023-03-26 ENCOUNTER — Other Ambulatory Visit: Payer: Self-pay

## 2023-03-26 ENCOUNTER — Ambulatory Visit
Admission: RE | Admit: 2023-03-26 | Discharge: 2023-03-26 | Disposition: A | Discharge status: Home / Self Care | Payer: No Typology Code available for payment source | Source: Ambulatory Visit | Attending: Family Medicine | Admitting: Family Medicine

## 2023-03-26 VITALS — BP 100/66 | HR 74 | Temp 98.3°F | Resp 18

## 2023-03-26 DIAGNOSIS — R059 Cough, unspecified: Secondary | ICD-10-CM

## 2023-03-26 MED ORDER — BENZONATATE 200 MG PO CAPS: 200.0000 mg | ORAL_CAPSULE | Freq: Three times a day (TID) | ORAL | 0 refills | Status: AC | PRN

## 2023-03-26 MED ORDER — DOXYCYCLINE HYCLATE 100 MG PO CAPS: 100.0000 mg | ORAL_CAPSULE | Freq: Two times a day (BID) | ORAL | 0 refills | Status: AC

## 2023-03-26 NOTE — ED Triage Notes (Signed)
Patient presents to Urgent Care with complaints of cough spells, nasal congestion, runny nose since 2 months. Patient reports history of pneumonia. Some shortness of breath. Has tried [employer]

## 2023-03-26 NOTE — Discharge Instructions (Addendum)
Advised patient to take medication as directed with food to completion.  Advised may take Tessalon capsules daily or as needed for cough.  Encouraged increase daily water intake to 64 ounces per day.  Advised patient if symptoms worsen and/or unresolved please follow-up with primary care provider (contact information for [facility] primary care provider included with his AVS) or here for further evaluation.

## 2023-03-26 NOTE — ED Provider Notes (Signed)
[facility]    CSN: [id number] Arrival date & time: 03/26/23  4098      History   Chief Complaint Chief Complaint  Patient presents with   Cough   Appointment    9:45a    HPI Aaron Woodward is a 63 y.o. male.   HPI 63 year old male presents with cough and nasal congestion with runny nose for 2 months.  Patient is unsure symptoms are related to influenza.  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 Authorizing Provider  benzonatate (TESSALON) 200 MG capsule Take 1 capsule (200 mg total) by mouth 3 (three) times daily as needed for up to 7 days. 03/26/23 04/02/23 Yes Trevor Iha, FNP  doxycycline (VIBRAMYCIN) 100 MG capsule Take 1 capsule (100 mg total) by mouth 2 (two) times daily for 7 days. 03/26/23 04/02/23 Yes Trevor Iha, FNP  tamsulosin (FLOMAX) 0.4 MG CAPS capsule Take 0.4 mg by mouth daily.   Yes [provider]  fluticasone (FLONASE) 50 MCG/ACT nasal spray Place 2 sprays into both nostrils daily. 02/13/15 01/12/20  Lurene Shadow, PA-C  montelukast (SINGULAIR) 10 MG tablet Take 1 tablet (10 mg total) by mouth at bedtime. 02/13/15 01/12/20  Lurene Shadow, PA-C    Family History Family History  Problem Relation Age of Onset   COPD Mother    Diabetes Mother     Social History Social History   Tobacco Use   Smoking status: Never   Smokeless tobacco: Never  Vaping Use   Vaping status: Never Used  Substance Use Topics   Alcohol use: Yes    Comment: rarely   Drug use: No     Allergies   Patient has no known  allergies.   Review of Systems Review of Systems  HENT:  Positive for congestion and rhinorrhea.   All other systems reviewed and are negative.    Physical Exam Triage Vital Signs ED Triage Vitals  Encounter Vitals Group     BP      Systolic BP Percentile      Diastolic BP Percentile      Pulse      Resp      Temp      Temp src      SpO2      Weight      Height      Head Circumference      Peak Flow      Pain Score      Pain Loc      Pain Education      Exclude from Growth Chart    No data found.  Updated Vital Signs BP 100/66 (BP Location: Left Arm)   Pulse 74   Temp 98.3 F (36.8 C) (Oral)   Resp 18   SpO2 98%    Physical Exam Vitals and nursing note reviewed.  Constitutional:      General: He is not in acute distress.    Appearance: Normal appearance. He is normal weight. He  is not ill-appearing.  HENT:     Head: Normocephalic and atraumatic.     Right Ear: Tympanic membrane, ear canal and external ear normal.     Left Ear: Tympanic membrane, ear canal and external ear normal.     Nose: Nose normal.     Mouth/Throat:     Mouth: Mucous membranes are moist.     Pharynx: Oropharynx is clear.  Eyes:     Extraocular Movements: Extraocular movements intact.     Conjunctiva/sclera: Conjunctivae normal.     Pupils: Pupils are equal, round, and reactive to light.  Cardiovascular:     Rate and Rhythm: Normal rate and regular rhythm.     Pulses: Normal pulses.     Heart sounds: Normal heart sounds.  Pulmonary:     Effort: Pulmonary effort is normal.     Breath sounds: Normal breath sounds. No wheezing, rhonchi or rales.     Comments: Infrequent nonproductive cough on exam Musculoskeletal:        General: Normal range of motion.     Cervical back: Normal range of motion and neck supple.  Skin:    General: Skin is warm and dry.  Neurological:     General: No focal deficit present.     Mental Status: He is alert and oriented to person, place, and time.  Mental status is at baseline.  Psychiatric:        Mood and Affect: Mood normal.        Behavior: Behavior normal.      UC Treatments / Results  Labs (all labs ordered are listed, but only abnormal results are displayed) Labs Reviewed - No data to display  EKG   Radiology No results found.  Procedures Procedures (including critical care time)  Medications Ordered in UC Medications - No data to display  Initial Impression / Assessment and Plan / UC Course  I have reviewed the triage vital signs and the nursing notes.  Pertinent labs & imaging results that were available during my care of the patient were reviewed by me and considered in my medical decision making (see chart for details).     MDM: 1.  Cough, unspecified type-Rx'd Doxycycline 100 mg capsule: Take 1 capsule twice daily x 7 days, Rx'd Tessalon 200 mg capsule: Take 1 capsule 3 times daily, as needed for cough. Advised patient to take medication as directed with food to completion.  Advised may take Tessalon capsules daily or as needed for cough.  Encouraged increase daily water intake to 64 ounces per day.  Advised patient if symptoms worsen and/or unresolved please follow-up with primary care provider (contact information for [facility] primary care provider included with his AVS) or here for further evaluation.  Patient discharged home, hemodynamically stable. Final Clinical Impressions(s) / UC Diagnoses   Final diagnoses:  Cough, unspecified type     Discharge Instructions      Advised patient to take medication as directed with food to completion.  Advised may take Tessalon capsules daily or as needed for cough.  Encouraged increase daily water intake to 64 ounces per day.  Advised patient if symptoms worsen and/or unresolved please follow-up with primary care provider (contact information for [facility] primary care provider included with his AVS) or here for further evaluation.     ED Prescriptions      Medication Sig Dispense Auth. Provider   doxycycline (VIBRAMYCIN) 100 MG capsule Take 1 capsule (100 mg total) by mouth 2 (two) times daily  for 7 days. 14 capsule Trevor Iha, FNP   benzonatate (TESSALON) 200 MG capsule Take 1 capsule (200 mg total) by mouth 3 (three) times daily as needed for up to 7 days. 40 capsule Trevor Iha, FNP      PDMP not reviewed this encounter.   Trevor Iha, FNP 03/26/23 1005

## 2024-02-19 ENCOUNTER — Other Ambulatory Visit: Payer: Self-pay

## 2024-02-19 ENCOUNTER — Ambulatory Visit
Admission: EM | Admit: 2024-02-19 | Discharge: 2024-02-19 | Disposition: A | Discharge status: Home / Self Care | Payer: Self-pay | Attending: Family Medicine | Admitting: Family Medicine

## 2024-02-19 DIAGNOSIS — M5441 Lumbago with sciatica, right side: Secondary | ICD-10-CM

## 2024-02-19 DIAGNOSIS — M545 Low back pain, unspecified: Secondary | ICD-10-CM

## 2024-02-19 MED ORDER — METHYLPREDNISOLONE 4 MG PO TBPK: ORAL_TABLET | ORAL | 0 refills | Status: AC

## 2024-02-19 MED ORDER — TIZANIDINE HCL 4 MG PO TABS: 4.0000 mg | ORAL_TABLET | Freq: Four times a day (QID) | ORAL | 0 refills | Status: AC | PRN

## 2024-02-19 MED ORDER — OXYCODONE-ACETAMINOPHEN 5-325 MG PO TABS: 1.0000 | ORAL_TABLET | Freq: Three times a day (TID) | ORAL | 0 refills | Status: AC | PRN

## 2024-02-19 MED ADMIN — Ketorolac Tromethamine IM Inj 60 MG/2ML (30 MG/ML): 60 mg | INTRAMUSCULAR | NDC 00409379601

## 2024-02-19 NOTE — Discharge Instructions (Signed)
 I have prescribed methylprednisolone .  This is a strong steroid.  It is for the nerve inflammation.  It comes in a Dosepak.  Take all of day 1 today as a single dose when you get the prescription filled.  Tomorrow take on schedule Take tizanidine  as needed as muscle relaxer.  1 or 2 at bedtime will help you rest Take Percocet as needed for pain.  Try 1 every 6-8 hours, may take a second pill if needed Go to the emergency room if you get worse instead of better Consider orthopedic if your back pain persists

## 2024-02-19 NOTE — ED Triage Notes (Signed)
 Has c/o low back pain worst pain of my life. Had a pain in right buttock going down right leg x 1 month then the low back pain started last night. Took 2 ibuprofen this morning.

## 2024-02-19 NOTE — ED Provider Notes (Signed)
 " [name]    CSN: [id number] Arrival date & time: 02/19/24  1359      History   Chief Complaint Chief Complaint  Patient presents with   Back Pain    HPI Aaron Woodward is a 63 y.o. male.     Patient is here for acute back pain.  He states he has been having some low back pain for about a month.  It radiates from his right buttock down the back of his leg.  He thought it was sciatica.  He has been managing with over-the-counter medications.  He states his pain has been about a 4 on the 1-10 scale.  Since last night he has severe pain.  Pain goes from the right low back down his right leg.  He can hardly move.  He can hardly get out of bed.  It took him 20 minutes to get from his vehicle into our office to register.  He has never had back pain before quite this severe.  He states it is a 10 out of 10.  No fever or chills.  No urinary symptoms no bowel or bladder complaint.  He has pain but no numbness and no weakness.  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 Authorizing Provider  methylPREDNISolone  (MEDROL  DOSEPAK) 4 MG TBPK tablet tad 02/19/24  Yes Maranda Jamee Jacob, MD  oxyCODONE -acetaminophen  (PERCOCET/ROXICET) 5-325 MG tablet Take 1-2 tablets by mouth every 8 (eight) hours as needed for severe pain (pain score 7-10). 02/19/24  Yes Maranda Jamee Jacob, MD  tiZANidine  (ZANAFLEX ) 4 MG tablet Take 1-2 tablets (4-8 mg total) by mouth every 6 (six) hours as needed for muscle spasms. 02/19/24  Yes Maranda Jamee Jacob, MD  tamsulosin (FLOMAX) 0.4 MG CAPS capsule Take 0.4 mg by mouth daily.    [provider]  fluticasone  (FLONASE ) 50 MCG/ACT nasal spray Place 2 sprays into both nostrils daily. 02/13/15 01/12/20  Anitra Rocky KIDD, PA-C  montelukast  (SINGULAIR ) 10 MG tablet Take 1 tablet (10 mg total) by mouth at bedtime. 02/13/15 01/12/20  Anitra Rocky KIDD, PA-C    Family History Family History  Problem Relation Age of Onset   COPD Mother    Diabetes Mother     Social History Social History[1]   Allergies   Patient has no known allergies.   Review of Systems Review of Systems See HPI  Physical Exam Triage Vital Signs ED Triage Vitals  Encounter Vitals Group     BP 02/19/24 1453 (!) 91/57     Girls Systolic BP Percentile --      Girls Diastolic BP Percentile --      Boys Systolic BP Percentile --      Boys Diastolic BP Percentile --      Pulse Rate 02/19/24 1453 79     Resp 02/19/24 1453 16     Temp 02/19/24 1453 99.3 F (37.4 C)     Temp src --  SpO2 02/19/24 1453 98 %     Weight --      Height --      Head Circumference --      Peak Flow --      Pain Score 02/19/24 1500 10     Pain Loc --      Pain Education --      Exclude from Growth Chart --    No data found.  Updated Vital Signs BP (!) 91/57   Pulse 79   Temp 99.3 F (37.4 C)   Resp 16   SpO2 98%      Physical Exam Constitutional:      General: He is in acute distress.     Appearance: He is well-developed.     Comments: Patient is in obvious acute distress.  Holds onto the end of the exam table.  Could hardly answer questions.  Pauses periodically with severe pain.  HENT:     Head: Normocephalic and atraumatic.  Eyes:     Conjunctiva/sclera: Conjunctivae normal.     Pupils: Pupils are equal, round, and reactive to light.  Cardiovascular:     Rate and Rhythm: Normal rate.  Pulmonary:     Effort: Pulmonary effort is normal. No respiratory distress.  Abdominal:     General: There is no  distension.     Palpations: Abdomen is soft.  Musculoskeletal:        General: Tenderness present. Normal range of motion.     Cervical back: Normal range of motion.     Comments: Moderate tenderness in the right SI joint area and in the right L5-S1 area.  Increased muscle tone in the lumbar muscles.  Range of motion not possible secondary to severe pain  Skin:    General: Skin is warm and dry.  Neurological:     General: No focal deficit present.     Mental Status: He is alert.     Sensory: No sensory deficit.     Motor: No weakness.     Gait: Gait abnormal.     Deep Tendon Reflexes: Reflexes normal.      UC Treatments / Results  Labs (all labs ordered are listed, but only abnormal results are displayed) Labs Reviewed - No data to display  EKG   Radiology No results found.  Procedures Procedures (including critical care time)  Medications Ordered in UC Medications  ketorolac  (TORADOL ) injection 60 mg (60 mg Intramuscular Given 02/19/24 1518)    Initial Impression / Assessment and Plan / UC Course  I have reviewed the triage vital signs and the nursing notes.  Pertinent labs & imaging results that were available during my care of the patient were reviewed by me and considered in my medical decision making (see chart for details).     I explained to the patient that I thought he belong to the emergency room.  I told him in the emergency room he would get pain medication, and imaging of his back.  The only pain medicine I can offer him is Toradol .  Patient accepts.  After 30 minutes his pain started to improve.  I told him he could try conservative treatment at home but must go to an ER if he gets worse instead of better. Final Clinical Impressions(s) / UC Diagnoses   Final diagnoses:  Severe low back pain  Acute right-sided low back pain with right-sided sciatica     Discharge Instructions      I have prescribed methylprednisolone .  This is a strong steroid.   It is for the nerve inflammation.  It comes in a Dosepak.  Take all of day 1 today as a single dose when you get the prescription filled.  Tomorrow take on schedule Take tizanidine  as needed as muscle relaxer.  1 or 2 at bedtime will help you rest Take Percocet as needed for pain.  Try 1 every 6-8 hours, may take a second pill if needed Go to the emergency room if you get worse instead of better Consider orthopedic if your back pain persists    ED Prescriptions     Medication Sig Dispense Auth. Provider   methylPREDNISolone  (MEDROL  DOSEPAK) 4 MG TBPK tablet tad 21 tablet Maranda Jamee Jacob, MD   tiZANidine  (ZANAFLEX ) 4 MG tablet Take 1-2 tablets (4-8 mg total) by mouth every 6 (six) hours as needed for muscle spasms. 21 tablet Maranda Jamee Jacob, MD   oxyCODONE -acetaminophen  (PERCOCET/ROXICET) 5-325 MG tablet Take 1-2 tablets by mouth every 8 (eight) hours as needed for severe pain (pain score 7-10). 15 tablet Maranda Jamee Jacob, MD      I have reviewed the PDMP during this encounter.    [1]  Social History Tobacco Use   Smoking status: Never   Smokeless tobacco: Never  Vaping Use   Vaping status: Never Used  Substance Use Topics   Alcohol use: Yes    Comment: rarely   Drug use: No     Maranda Jamee Jacob, MD 02/19/24 1907  "

## 2024-02-21 ENCOUNTER — Ambulatory Visit

## 2024-02-21 ENCOUNTER — Ambulatory Visit
Admission: EM | Admit: 2024-02-21 | Discharge: 2024-02-21 | Disposition: A | Discharge status: Home / Self Care | Attending: Family Medicine | Admitting: Family Medicine

## 2024-02-21 ENCOUNTER — Telehealth: Payer: Self-pay | Admitting: Family Medicine

## 2024-02-21 DIAGNOSIS — M5442 Lumbago with sciatica, left side: Secondary | ICD-10-CM

## 2024-02-21 DIAGNOSIS — M5441 Lumbago with sciatica, right side: Secondary | ICD-10-CM | POA: Diagnosis not present

## 2024-02-21 NOTE — ED Triage Notes (Signed)
 Pt c/o back pain x 1 month. Pain starts in RT side buttocks radiating down leg. Was seen in two days ago in UC. Tx with toradol , prednisone , tizanidine  and percocet. Not much improvement since last visit. Would like xrays today.

## 2024-02-21 NOTE — Discharge Instructions (Addendum)
 Advised patient of lumbar spine x-ray results with hardcopy provided.  Advised patient if symptoms worsen and are unresolved please follow-up with your PCP for further evaluation of lower back to include probable CT or possibly MRI.

## 2024-02-21 NOTE — ED Provider Notes (Signed)
 " Aaron Woodward    CSN: [id number] Arrival date & time: 02/21/24  1448      History   Chief Complaint Chief Complaint  Patient presents with   Back Pain    HPI Aaron Woodward is a 63 y.o. male.   HPI 63 year old male presents with continued back pain.  Patient was evaluated here on 02/19/2024 for severe lower back pain.  Please see epic for that encounter note.  PMH significant for BPH with urinary obstruction and history of testicular surgery.  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 Authorizing Provider  methylPREDNISolone  (MEDROL  DOSEPAK) 4 MG TBPK tablet tad 02/19/24   Maranda Jamee Jacob, MD  oxyCODONE -acetaminophen  (PERCOCET/ROXICET) 5-325 MG tablet Take 1-2 tablets by mouth every 8 (eight) hours as needed for severe pain (pain score 7-10). 02/19/24   Maranda Jamee Jacob, MD  tamsulosin (FLOMAX) 0.4 MG CAPS capsule Take 0.4 mg by mouth daily.    [provider]  tiZANidine  (ZANAFLEX ) 4 MG tablet Take 1-2 tablets (4-8 mg total) by mouth every 6 (six) hours as needed for muscle spasms. 02/19/24   Maranda Jamee Jacob, MD  fluticasone  (FLONASE ) 50 MCG/ACT nasal spray Place 2 sprays into both nostrils daily. 02/13/15 01/12/20  Anitra Rocky KIDD, PA-C  montelukast  (SINGULAIR ) 10 MG tablet Take 1 tablet (10 mg total) by mouth at bedtime. 02/13/15 01/12/20  Anitra Rocky KIDD, PA-C    Family History Family History  Problem Relation Age of Onset   COPD Mother    Diabetes Mother     Social History Social History[1]   Allergies   Patient has no known  allergies.   Review of Systems Review of Systems  Musculoskeletal:  Positive for back pain.     Physical Exam Triage Vital Signs ED Triage Vitals  Encounter Vitals Group     BP      Girls Systolic BP Percentile      Girls Diastolic BP Percentile      Boys Systolic BP Percentile      Boys Diastolic BP Percentile      Pulse      Resp      Temp      Temp src      SpO2      Weight      Height      Head Circumference      Peak Flow      Pain Score      Pain Loc      Pain Education      Exclude from Growth Chart    No data found.  Updated Vital Signs BP 99/65 (BP Location: Right Arm)   Pulse 74   Temp 99.4 F (37.4 C) (Oral)   Resp 17   SpO2 97%    Physical Exam Vitals and nursing note reviewed.  Constitutional:      Appearance: Normal appearance. He is normal weight.  HENT:     Head: Normocephalic and atraumatic.     Mouth/Throat:  Mouth: Mucous membranes are moist.     Pharynx: Oropharynx is clear.  Eyes:     Extraocular Movements: Extraocular movements intact.     Conjunctiva/sclera: Conjunctivae normal.     Pupils: Pupils are equal, round, and reactive to light.  Cardiovascular:     Rate and Rhythm: Regular rhythm.     Heart sounds: Normal heart sounds.  Pulmonary:     Effort: Pulmonary effort is normal.     Breath sounds: Normal breath sounds. No wheezing, rhonchi or rales.  Chest:     Chest wall: No tenderness.  Abdominal:     Tenderness: There is no right CVA tenderness or left CVA tenderness.  Musculoskeletal:        General: Normal range of motion.     Comments: Lumbar sacral spine central aspect over L5-S1: Patient reporting of tenderness of this area, no deformity noted  Skin:    General: Skin is warm and dry.  Neurological:     General: No focal deficit present.     Mental Status: He is alert and oriented to person, place, and time.  Psychiatric:        Mood and Affect: Mood normal.        Behavior: Behavior normal.      UC  Treatments / Results  Labs (all labs ordered are listed, but only abnormal results are displayed) Labs Reviewed - No data to display  EKG   Radiology DG Lumbar Spine Complete Result Date: 02/21/2024 CLINICAL DATA:  Low back pain. EXAM: LUMBAR SPINE - COMPLETE 4+ VIEW COMPARISON:  CT dated 10/11/2022. FINDINGS: Five lumbar type vertebra. There is no acute fracture or subluxation of the lumbar spine. Multilevel degenerative changes with disc space narrowing and spurring primarily at L4-L5 and L5-S1. Lower lumbar facet arthropathy. The soft tissues are unremarkable. IMPRESSION: 1. No acute findings. 2. Multilevel degenerative changes. Electronically Signed   By: Vanetta Chou M.D.   On: 02/21/2024 15:45    Procedures Procedures (including critical Woodward time)  Medications Ordered in UC Medications - No data to display  Initial Impression / Assessment and Plan / UC Course  I have reviewed the triage vital signs and the nursing notes.  Pertinent labs & imaging results that were available during my Woodward of the patient were reviewed by me and considered in my medical decision making (see chart for details).     MDM: 1.  Acute midline back pain with bilateral sciatica-lumbar spine x-ray results revealed above-DJD lumbar spine revealed above, patient advised. Advised patient of lumbar spine x-ray results with hardcopy provided.  Advised patient if symptoms worsen and are unresolved please follow-up with your PCP for further evaluation of lower back to include probable CT or possibly MRI.  Patient discharged home, hemodynamically stable. Final Clinical Impressions(s) / UC Diagnoses   Final diagnoses:  Acute midline low back pain with bilateral sciatica     Discharge Instructions      Advised patient of lumbar spine x-ray results with hardcopy provided.  Advised patient if symptoms worsen and are unresolved please follow-up with your PCP for further evaluation of lower back to include  probable CT or possibly MRI.     ED Prescriptions   None    I have reviewed the PDMP during this encounter.    [1]  Social History Tobacco Use   Smoking status: Never   Smokeless tobacco: Never  Vaping Use   Vaping status: Never Used  Substance Use Topics   Alcohol use:  Yes    Comment: rarely   Drug use: No     Teddy Sharper, FNP 02/21/24 1601  "

## 2024-02-23 NOTE — Telephone Encounter (Signed)
Medications sent to pharmacy per request
